# Patient Record
Sex: Male | Born: 1970 | Race: White | Hispanic: No | Marital: Married | Smoking: Never smoker
Health system: Southern US, Community
[De-identification: ages and names within clinical notes are randomized; demographics above are authoritative.]

## PROBLEM LIST (undated history)

## (undated) DIAGNOSIS — F419 Anxiety disorder, unspecified: Secondary | ICD-10-CM

## (undated) DIAGNOSIS — I10 Essential (primary) hypertension: Secondary | ICD-10-CM

## (undated) DIAGNOSIS — K219 Gastro-esophageal reflux disease without esophagitis: Secondary | ICD-10-CM

## (undated) DIAGNOSIS — G4730 Sleep apnea, unspecified: Secondary | ICD-10-CM

## (undated) DIAGNOSIS — J342 Deviated nasal septum: Secondary | ICD-10-CM

## (undated) HISTORY — PX: BACK SURGERY: SHX140

---

## 2001-09-23 ENCOUNTER — Ambulatory Visit (HOSPITAL_COMMUNITY): Admission: RE | Admit: 2001-09-23 | Discharge: 2001-09-23 | Payer: Self-pay | Admitting: *Deleted

## 2001-09-23 ENCOUNTER — Encounter: Payer: Self-pay | Admitting: *Deleted

## 2001-10-20 ENCOUNTER — Encounter: Payer: Self-pay | Admitting: Neurosurgery

## 2001-10-20 ENCOUNTER — Ambulatory Visit (HOSPITAL_COMMUNITY): Admission: RE | Admit: 2001-10-20 | Discharge: 2001-10-21 | Payer: Self-pay | Admitting: Neurosurgery

## 2015-10-13 ENCOUNTER — Emergency Department (HOSPITAL_BASED_OUTPATIENT_CLINIC_OR_DEPARTMENT_OTHER): Payer: No Typology Code available for payment source

## 2015-10-13 ENCOUNTER — Encounter (HOSPITAL_BASED_OUTPATIENT_CLINIC_OR_DEPARTMENT_OTHER): Payer: Self-pay | Admitting: Emergency Medicine

## 2015-10-13 ENCOUNTER — Observation Stay (HOSPITAL_BASED_OUTPATIENT_CLINIC_OR_DEPARTMENT_OTHER)
Admission: EM | Admit: 2015-10-13 | Discharge: 2015-10-14 | Disposition: A | Discharge status: Home / Self Care | Payer: No Typology Code available for payment source | Attending: Family Medicine | Admitting: Family Medicine

## 2015-10-13 DIAGNOSIS — R0600 Dyspnea, unspecified: Secondary | ICD-10-CM

## 2015-10-13 DIAGNOSIS — Z87898 Personal history of other specified conditions: Secondary | ICD-10-CM

## 2015-10-13 DIAGNOSIS — Z79899 Other long term (current) drug therapy: Secondary | ICD-10-CM | POA: Insufficient documentation

## 2015-10-13 DIAGNOSIS — R911 Solitary pulmonary nodule: Secondary | ICD-10-CM | POA: Insufficient documentation

## 2015-10-13 DIAGNOSIS — R0602 Shortness of breath: Secondary | ICD-10-CM | POA: Insufficient documentation

## 2015-10-13 DIAGNOSIS — Z8249 Family history of ischemic heart disease and other diseases of the circulatory system: Secondary | ICD-10-CM | POA: Diagnosis not present

## 2015-10-13 DIAGNOSIS — R072 Precordial pain: Secondary | ICD-10-CM | POA: Diagnosis not present

## 2015-10-13 DIAGNOSIS — S022XXA Fracture of nasal bones, initial encounter for closed fracture: Secondary | ICD-10-CM

## 2015-10-13 DIAGNOSIS — R918 Other nonspecific abnormal finding of lung field: Secondary | ICD-10-CM | POA: Diagnosis present

## 2015-10-13 DIAGNOSIS — R079 Chest pain, unspecified: Secondary | ICD-10-CM | POA: Diagnosis present

## 2015-10-13 DIAGNOSIS — I10 Essential (primary) hypertension: Secondary | ICD-10-CM | POA: Diagnosis not present

## 2015-10-13 DIAGNOSIS — R0689 Other abnormalities of breathing: Secondary | ICD-10-CM

## 2015-10-13 DIAGNOSIS — D491 Neoplasm of unspecified behavior of respiratory system: Secondary | ICD-10-CM

## 2015-10-13 HISTORY — DX: Essential (primary) hypertension: I10

## 2015-10-13 LAB — D-DIMER, QUANTITATIVE: D-Dimer, Quant: <0.27 ug/mL-FEU (ref 0.00–0.50)

## 2015-10-13 LAB — CBC WITH DIFFERENTIAL/PLATELET
Basophils Absolute: 0 10*3/uL (ref 0.0–0.1)
Basophils Relative: 0 %
EOS ABS: 0 10*3/uL (ref 0.0–0.7)
Eosinophils Relative: 0 %
HEMATOCRIT: 44.9 % (ref 39.0–52.0)
HEMOGLOBIN: 15.1 g/dL (ref 13.0–17.0)
LYMPHS ABS: 2 10*3/uL (ref 0.7–4.0)
Lymphocytes Relative: 29 %
MCH: 29.6 pg (ref 26.0–34.0)
MCHC: 33.6 g/dL (ref 30.0–36.0)
MCV: 88 fL (ref 78.0–100.0)
MONOS PCT: 9 %
Monocytes Absolute: 0.6 10*3/uL (ref 0.1–1.0)
NEUTROS ABS: 4.2 10*3/uL (ref 1.7–7.7)
NEUTROS PCT: 62 %
Platelets: 173 10*3/uL (ref 150–400)
RBC: 5.1 MIL/uL (ref 4.22–5.81)
RDW: 12.4 % (ref 11.5–15.5)
WBC: 6.8 10*3/uL (ref 4.0–10.5)

## 2015-10-13 LAB — BASIC METABOLIC PANEL
Anion gap: 9 (ref 5–15)
BUN: 12 mg/dL (ref 6–20)
CO2: 27 mmol/L (ref 22–32)
Calcium: 9 mg/dL (ref 8.9–10.3)
Chloride: 103 mmol/L (ref 101–111)
Creatinine, Ser: 1.01 mg/dL (ref 0.61–1.24)
GFR calc Af Amer: >60 mL/min (ref >60)
GFR calc non Af Amer: >60 mL/min (ref >60)
Glucose, Bld: 113 mg/dL — ABNORMAL HIGH (ref 65–99)
Potassium: 3.5 mmol/L (ref 3.5–5.1)
Sodium: 139 mmol/L (ref 135–145)

## 2015-10-13 LAB — TROPONIN I: Troponin I: <0.03 ng/mL (ref <0.03)

## 2015-10-13 MED ORDER — IOPAMIDOL (ISOVUE-300) INJECTION 61%
100.0000 mL | Freq: Once | INTRAVENOUS | Status: AC | PRN
  Administered 2015-10-13: 100 mL via INTRAVENOUS

## 2015-10-13 MED ORDER — ASPIRIN 81 MG PO CHEW
324.0000 mg | CHEWABLE_TABLET | Freq: Once | ORAL | Status: AC
  Administered 2015-10-13: 324 mg via ORAL
  Filled 2015-10-13: qty 4

## 2015-10-13 MED ORDER — NITROGLYCERIN 0.4 MG SL SUBL
0.4000 mg | SUBLINGUAL_TABLET | SUBLINGUAL | Status: DC | PRN
  Administered 2015-10-13 (×2): 0.4 mg via SUBLINGUAL
  Filled 2015-10-13: qty 1

## 2015-10-13 NOTE — ED Triage Notes (Addendum)
Pt in c/o central chest pain described as pressure and SOB, lightheadedness since last night. Grandfather and father died early of MIs. Pt is alert, interactive, in NAD

## 2015-10-13 NOTE — ED Notes (Signed)
RN at the bedside while giving Nitroglycerin

## 2015-10-13 NOTE — ED Provider Notes (Signed)
[facility] Provider Note   CSN: [id number] Arrival date & time: 10/13/15  2221  By signing my name below, I, Jeanell Sparrow, attest that this documentation has been prepared under the direction and in the presence of non-physician practitioner, Anne Ng, PA-C. Electronically Signed: Jeanell Sparrow, Scribe. 10/13/2015. 10:34 PM.  History   Chief Complaint Chief Complaint  Patient presents with  . Chest Pain  . Shortness of Breath   The history is provided by the patient. No language interpreter was used.   HPI Comments: William [name] is a 45 y.o. male who presents to the Emergency Department complaining of substernal chest pain that started 16 hours ago. He reports waking up from sleep in pain. Pt states it feels like heavy pressure on his chest and indigestion. Pt describes the pain as a 5/10 in severity, pressure sensation, and is exacerbated by exertion. He took no medication PTA. Pt reports associated symptoms of SOB, dry cough, and chills. 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 He reports a family hx of CAD; father had MI at age 92 (still living) and grandfather died from an MI at age 73. Pt denies any hx of HLD, DM, hx of smoking,  nausea, diaphoresis, or other complaints. STates he had a similar episode a couple months ago and went to [place] where it was suggested that he be admitted but he chose to leave AMA.  Past Medical History:  Diagnosis Date  . Hypertension     There are no active problems to display for this patient.  No past surgical history on file.   Home Medications    Prior to Admission medications   Not on File    Family History No family history on file.  Social History Social History  Substance Use Topics  . Smoking status: Never Smoker  . Smokeless tobacco: Not on file  . Alcohol use No    Allergies   Review of patient's allergies indicates no known allergies.  Review of  Systems Review of Systems  Constitutional: Positive for chills. Negative for diaphoresis.  Respiratory: Positive for cough and shortness of breath.   Cardiovascular: Positive for chest pain.  Gastrointestinal: Negative for nausea.  All other systems reviewed and are negative.    Physical Exam Updated Vital Signs BP 151/89   Pulse 79   Temp 98.5 F (36.9 C)   Resp 20   SpO2 98%   Physical Exam  Constitutional: He is oriented to person, place, and time.  Appears uncomfortable but NAD  HENT:  Right Ear: External ear normal.  Left Ear: External ear normal.  Nose: Nose normal.  Mouth/Throat: Oropharynx is clear and moist. No oropharyngeal exudate.  Eyes: Conjunctivae are normal.  Neck: Neck supple.  Cardiovascular: Normal rate, regular rhythm, normal heart sounds and intact distal pulses.   Pulmonary/Chest: Breath sounds normal. He has no wheezes.  Mildly increased WOB and will take deep breaths between sentences  Abdominal: Soft. Bowel sounds are normal. He exhibits no distension. There is no tenderness. There is no rebound and no guarding.  Musculoskeletal:  No LE edema or calf tenderness  Lymphadenopathy:    He has no cervical adenopathy.  Neurological: He is alert and oriented to person, place, and time. No cranial nerve deficit.  Skin: Skin is warm and dry. He is not diaphoretic.  Psychiatric: He has a normal mood and affect.  Nursing note and vitals reviewed.  ED Treatments / Results  DIAGNOSTIC STUDIES: Oxygen Saturation is 98% on RA, normal by my interpretation.    COORDINATION OF CARE: 10:38 PM- Pt advised of plan for treatment and pt agrees.  Labs (all labs ordered are listed, but only abnormal results are displayed) Labs Reviewed  BASIC METABOLIC PANEL - Abnormal; Notable for the following:       Result Value   Glucose, Bld 113 (*)    All other components within normal limits  TROPONIN I  CBC WITH DIFFERENTIAL/PLATELET  D-DIMER, QUANTITATIVE (NOT AT  [facility])    EKG  EKG Interpretation  Date/Time:  Saturday October 13 2015 22:26:53 EDT Ventricular Rate:  80 PR Interval:    QRS Duration: 109 QT Interval:  407 QTC Calculation: 470 R Axis:   85 Text Interpretation:  Sinus rhythm Atrial premature complex Nonspecific T abnormalities, lateral leads No previous ECGs available Confirmed by Florina Ou  MD, Jenny Reichmann ([id number]) on 10/13/2015 10:53:43 PM       Radiology Dg Chest 2 View  Result Date: 10/13/2015 CLINICAL DATA:  Central chest pain and pressure. Shortness of breath and lightheadedness. EXAM: CHEST  2 VIEW COMPARISON:  09/09/2015 FINDINGS: Normal heart size and pulmonary vascularity. Slight nodular prominence of the right hilum is probably vascular but hilar lymphadenopathy is not excluded. Suggest CT for further evaluation. No focal airspace disease or consolidation. No blunting of costophrenic angles. No pneumothorax. Mediastinal contours appear intact. IMPRESSION: Nodular prominence of the right hilum is probably vascular but lymphadenopathy is not excluded and CT is suggested for further evaluation. No evidence of active pulmonary disease. Electronically Signed   By: Lucienne Capers M.D.   On: 10/13/2015 23:21   Ct Chest W Contrast  Result Date: 10/14/2015 CLINICAL DATA:  Central chest pain.  Abnormal chest x-ray. EXAM: CT CHEST WITH CONTRAST TECHNIQUE: Multidetector CT imaging of the chest was performed during intravenous contrast administration. CONTRAST:  161mL ISOVUE-300 IOPAMIDOL (ISOVUE-300) INJECTION 61% COMPARISON:  Chest x-ray from earlier today FINDINGS: Cardiovascular: No significant vascular findings. Normal heart size. No pericardial effusion. Mediastinum/Nodes: Other than the nodule abutting the right hilum described below, the mediastinum is otherwise normal. No other suspicious mass or adenopathy. The central great vessels are normal. The heart is normal in size. No effusions. Lungs/Pleura: There is a nodule in the right mid lung  on series 2, image 73 measuring 2.5 cm, abutting the hilum. Whether this arises in the lung or the hilum is unclear. A calcified granuloma seen in the right upper lobe on image 65. A tiny nodule in the left lung on image 74 is too small to characterize, along the minor fissure measuring 4 mm. This is visualized on image 74. No other nodules or masses. Upper Abdomen: No acute abnormality. Musculoskeletal: No chest wall abnormality. No acute or significant osseous findings. IMPRESSION: 1. There is a 2.5 cm nodule abutting the right hilum. Whether this arises from the hilum or in the adjacent lung is unclear. 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 Malignancy not excluded. Recommend pulmonary consultation with consideration of a PET-CT. Electronically Signed   By: Dorise Bullion III M.D   On: 10/14/2015 00:12    Procedures Procedures (including critical care time)  Medications Ordered in ED Medications  nitroGLYCERIN (NITROSTAT) SL tablet 0.4 mg (0.4 mg Sublingual Given 10/13/15 2320)  diazepam (VALIUM) tablet 5 mg (not administered)  aspirin chewable tablet 324 mg (324 mg Oral Given 10/13/15 2311)  iopamidol (ISOVUE-300) 61 % injection 100 mL (100 mLs Intravenous Contrast  Given 10/13/15 2349)     Initial Impression / Assessment and Plan / ED Course  I have reviewed the triage vital signs and the nursing notes.  Pertinent labs & imaging results that were available during my care of the patient were reviewed by me and considered in my medical decision making (see chart for details).  Clinical Course   11:40 PM Pain improved with ASA and SL nitro x2. EKG with some T-wave and ST changes laterally. Troponin, d-dimer, CBC, and BMP negative. CXR with area of prominence to right hilum; cannot exclude lymphadenopathy and chest CT is recommend. CT chest with contrast ordered. HEART score 4.  12:51 AM CT chest shows 2.5cm nodule consistent with neoplasm, malignancy not excluded. Pulm consult and  PET-CT recommended. I discussed these findings with Dr. Florina Ou, my attending physician. We are not entirely convinced pt's symptoms are due to this mass. I do still feel pt would benefit from obs medical admission for chest pain workup. I spoke with pt and his family and they verbalized their understanding of today's findings and plan to transfer to [facility] for hospitalist admission. I spoke with Dr. Loleta Books of the hospitalist service who accepts the pt to tele obs. Appreciate assistance.  Final Clinical Impressions(s) / ED Diagnoses   Final diagnoses:  SOB (shortness of breath)  Chest pain, unspecified type  Pulmonary neoplasm    New Prescriptions New Prescriptions   No medications on file   I personally performed the services described in this documentation, which was scribed in my presence. The recorded information has been reviewed and is accurate.    Anne Ng, PA-C 10/14/15 [place], MD 10/14/15 [phone]

## 2015-10-13 NOTE — ED Notes (Signed)
Patient transported to CT 

## 2015-10-14 ENCOUNTER — Observation Stay (HOSPITAL_COMMUNITY): Payer: No Typology Code available for payment source

## 2015-10-14 ENCOUNTER — Encounter (HOSPITAL_COMMUNITY): Payer: Self-pay | Admitting: Internal Medicine

## 2015-10-14 ENCOUNTER — Telehealth: Payer: Self-pay | Admitting: Internal Medicine

## 2015-10-14 DIAGNOSIS — Z87898 Personal history of other specified conditions: Secondary | ICD-10-CM | POA: Diagnosis not present

## 2015-10-14 DIAGNOSIS — R911 Solitary pulmonary nodule: Secondary | ICD-10-CM

## 2015-10-14 DIAGNOSIS — R0600 Dyspnea, unspecified: Secondary | ICD-10-CM | POA: Diagnosis not present

## 2015-10-14 DIAGNOSIS — R079 Chest pain, unspecified: Secondary | ICD-10-CM | POA: Diagnosis not present

## 2015-10-14 DIAGNOSIS — R0602 Shortness of breath: Secondary | ICD-10-CM | POA: Diagnosis not present

## 2015-10-14 DIAGNOSIS — R918 Other nonspecific abnormal finding of lung field: Secondary | ICD-10-CM | POA: Diagnosis present

## 2015-10-14 DIAGNOSIS — I10 Essential (primary) hypertension: Secondary | ICD-10-CM | POA: Diagnosis not present

## 2015-10-14 DIAGNOSIS — R0689 Other abnormalities of breathing: Secondary | ICD-10-CM | POA: Diagnosis not present

## 2015-10-14 LAB — LIPID PANEL
Cholesterol: 188 mg/dL (ref 0–200)
HDL: 37 mg/dL — AB (ref >40)
LDL CALC: 126 mg/dL — AB (ref 0–99)
TRIGLYCERIDES: 127 mg/dL (ref <150)
Total CHOL/HDL Ratio: 5.1 RATIO
VLDL: 25 mg/dL (ref 0–40)

## 2015-10-14 LAB — RAPID URINE DRUG SCREEN, HOSP PERFORMED
Amphetamines: NOT DETECTED
BARBITURATES: NOT DETECTED
Benzodiazepines: NOT DETECTED
Cocaine: NOT DETECTED
Opiates: NOT DETECTED
Tetrahydrocannabinol: NOT DETECTED

## 2015-10-14 LAB — TROPONIN I: Troponin I: <0.03 ng/mL (ref <0.03)

## 2015-10-14 LAB — PROTIME-INR
INR: 1.04
PROTHROMBIN TIME: 13.6 s (ref 11.4–15.2)

## 2015-10-14 MED ORDER — ACETAMINOPHEN 325 MG PO TABS: 650.0000 mg | ORAL_TABLET | ORAL | Status: DC | PRN

## 2015-10-14 MED ORDER — ONDANSETRON HCL 4 MG/2ML IJ SOLN: 4.0000 mg | Freq: Four times a day (QID) | INTRAMUSCULAR | Status: DC | PRN

## 2015-10-14 MED ORDER — ATORVASTATIN CALCIUM 40 MG PO TABS
40.0000 mg | ORAL_TABLET | Freq: Every day | ORAL | Status: DC
  Administered 2015-10-14 (×2): 40 mg via ORAL
  Filled 2015-10-14 (×2): qty 1

## 2015-10-14 MED ORDER — IOPAMIDOL (ISOVUE-370) INJECTION 76%
INTRAVENOUS | Status: AC
  Administered 2015-10-14: 80 mL
  Filled 2015-10-14: qty 100

## 2015-10-14 MED ORDER — ASPIRIN 81 MG PO CHEW
324.0000 mg | CHEWABLE_TABLET | Freq: Every day | ORAL | Status: DC
  Administered 2015-10-14: 324 mg via ORAL
  Filled 2015-10-14: qty 4

## 2015-10-14 MED ORDER — LORATADINE 10 MG PO TABS: 10.0000 mg | ORAL_TABLET | Freq: Every day | ORAL | 0 refills | Status: DC

## 2015-10-14 MED ORDER — ALPRAZOLAM 0.25 MG PO TABS: 0.2500 mg | ORAL_TABLET | Freq: Two times a day (BID) | ORAL | Status: DC | PRN

## 2015-10-14 MED ORDER — CARVEDILOL 3.125 MG PO TABS
3.1250 mg | ORAL_TABLET | Freq: Two times a day (BID) | ORAL | Status: DC
  Administered 2015-10-14 (×2): 3.125 mg via ORAL
  Filled 2015-10-14 (×2): qty 1

## 2015-10-14 MED ORDER — ENOXAPARIN SODIUM 40 MG/0.4ML ~~LOC~~ SOLN
40.0000 mg | SUBCUTANEOUS | Status: DC
  Administered 2015-10-14: 40 mg via SUBCUTANEOUS
  Filled 2015-10-14: qty 0.4

## 2015-10-14 MED ORDER — SODIUM CHLORIDE 0.9 % IV SOLN
INTRAVENOUS | Status: DC
  Administered 2015-10-14: 100 mL/h via INTRAVENOUS

## 2015-10-14 MED ORDER — ALBUTEROL SULFATE (2.5 MG/3ML) 0.083% IN NEBU: 2.5000 mg | INHALATION_SOLUTION | RESPIRATORY_TRACT | Status: DC | PRN

## 2015-10-14 MED ORDER — METOPROLOL TARTRATE 5 MG/5ML IV SOLN
5.0000 mg | Freq: Once | INTRAVENOUS | Status: AC
  Administered 2015-10-14: 5 mg via INTRAVENOUS
  Filled 2015-10-14: qty 5

## 2015-10-14 MED ORDER — NITROGLYCERIN 0.4 MG SL SUBL
0.8000 mg | SUBLINGUAL_TABLET | Freq: Once | SUBLINGUAL | Status: AC
Start: 2015-10-14 — End: 2015-10-14
  Administered 2015-10-14: 0.8 mg via SUBLINGUAL

## 2015-10-14 MED ORDER — ZOLPIDEM TARTRATE 5 MG PO TABS: 5.0000 mg | ORAL_TABLET | Freq: Every evening | ORAL | Status: DC | PRN

## 2015-10-14 MED ORDER — DM-GUAIFENESIN ER 30-600 MG PO TB12: 1.0000 | ORAL_TABLET | Freq: Two times a day (BID) | ORAL | Status: DC | PRN

## 2015-10-14 MED ORDER — MORPHINE SULFATE (PF) 2 MG/ML IV SOLN: 2.0000 mg | INTRAVENOUS | Status: DC | PRN

## 2015-10-14 MED ORDER — DIAZEPAM 5 MG PO TABS
5.0000 mg | ORAL_TABLET | Freq: Once | ORAL | Status: AC
  Administered 2015-10-14: 5 mg via ORAL
  Filled 2015-10-14: qty 1

## 2015-10-14 MED ORDER — NITROGLYCERIN 0.4 MG SL SUBL
SUBLINGUAL_TABLET | SUBLINGUAL | Status: AC
  Administered 2015-10-14: 0.8 mg via SUBLINGUAL
  Filled 2015-10-14: qty 2

## 2015-10-14 NOTE — Progress Notes (Signed)
Transfer request for chest pain rule out.  44 yo M with no personal medical history presents with chest pain.    Woke him from sleep, pain is substernal, pressure-like, and worsened with exertion per EDP.  Patient has father and paternal GF with MI at age ~83.  ECG shows lateral TWI/flattening, no previous for comparison.  Initial troponin negative.  D-dimer normal.  HEART score 4.  Accepted to tele, OBS status for CP rule out.  Incidentally, patient had nodule on CXR, CT obtained in follow up that shows 2.5 cm mass.  Needs pulm consult as outpatient.

## 2015-10-14 NOTE — Progress Notes (Signed)
Coronary CT angio shows normal coronaries. There is mild LVH, which explains his S4 and may explain the ECG changes (secondary repol changes due to LVH with high voltage masked by obesity). LVH is likely due to HTN and obesity. Upper normal size of PA may increase concern for untreated OSA. He should have sleep evaluation after his nasal fracture situation is resolved. Otherwise, no plans for additional cardiac workup planned at this time.  Sanda Klein, MD, [facility] [phone] office [phone] pager

## 2015-10-14 NOTE — H&P (Signed)
History and Physical    William [name] [id number] DOB: 10-Aug-1970 DOA: 10/13/2015  Referring MD/NP/PA:   PCP: Ann Held, MD   Patient coming from:  The patient is coming from home.  At baseline, pt is independent for most of ADL.  Chief Complaint: Chest pain  HPI: William [name] is a 45 y.o. male with medical history significant of borderline hypertension, who presents with chest pain.  Patient states that she was waken up with chest pain at about 3 AM yesterday. His chest pain is located in the substernal area, constant, pressure-like, 3-5 out of 10 in severity, nonradiating. It is exertional, aggravated by movement. It is associated with SOB. He has chills, but no fever. 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 He has left leg cramping pain sometimes, but no tenderness over calf areas. He states that he has mild coughing, which has been going on for several weeks. He coughs up a little white mucus sometimes. No hemoptysis. No night sweating. No runny nose or sore throat. Patient does not have nausea, vomiting, abdominal pain, symptoms of UTI or unilateral weakness.   Pt sates he had a similar episode a couple months ago and went to [place] where it was suggested that he be admitted but he chose to leave AMA. He reports a family hx of CAD; father had MI at age 48 (still living) and grandfather died from an MI at age 45.   ED Course: pt was found to have negative troponin, WBC 6.8, negative d-dimer, electrolytes and renal function okay, temperature normal, transient oxygen saturation at 91%, currently 98% on room air. Chest x-ray showed nodular prominence of right hilum. CT chest with contrast showed 2.5 cm nodule in the right hilum, suspect neoplasm. Pt is placed on tele bed.  Review of Systems:   General: no fevers, chills, no changes in body weight, has poor appetite, has fatigue HEENT: no blurry vision, hearing changes or sore throat Respiratory: has dyspnea, coughing, no  wheezing CV: has chest pain, no palpitations GI: no nausea, vomiting, abdominal pain, diarrhea, constipation GU: no dysuria, burning on urination, increased urinary frequency, hematuria  Ext: no leg edema Neuro: no unilateral weakness, numbness, or tingling, no vision change or hearing loss Skin: no rash, no skin tear. MSK: No muscle spasm, no deformity, no limitation of range of movement in spin Heme: No easy bruising.  Travel history: No recent long distant travel.  Allergy: No Known Allergies  Past Medical History:  Diagnosis Date  . Hypertension     Past Surgical History:  Procedure Laterality Date  . BACK SURGERY      Social History:  reports that he has never smoked. He does not have any smokeless tobacco history on file. He reports that he does not drink alcohol. His drug history is not on file.  Family History:  Family History  Problem Relation Age of Onset  . Heart attack Father   . Heart attack Paternal Grandfather      Prior to Admission medications   Not on File    Physical Exam: Vitals:   10/14/15 0200 10/14/15 0230 10/14/15 0300 10/14/15 0427  BP: 120/82 117/79 128/92 (!) 143/91  Pulse: 76 68 71 79  Resp: 16 17 18    Temp:    98.2 F (36.8 C)  TempSrc:    Oral  SpO2: 98% 96% 98% 98%  Weight:    115.8 kg (255 lb 4.8 oz)  Height:    5\' 10"  (1.778 m)  General: Not in acute distress HEENT:       Eyes: PERRL, EOMI, no scleral icterus.       ENT: No discharge from the ears and nose, no pharynx injection, no tonsillar enlargement.        Neck: No JVD, no bruit, no mass felt. Heme: No neck lymph node enlargement. Cardiac: S1/S2, RRR, No murmurs, No gallops or rubs. Respiratory: No rales, wheezing, rhonchi or rubs. GI: Soft, nondistended, nontender, no rebound pain, no organomegaly, BS present. GU: No hematuria Ext: No pitting leg edema bilaterally. 2+DP/PT pulse bilaterally. Musculoskeletal: No joint deformities, No joint redness or warmth, no  limitation of ROM in spin. Skin: No rashes.  Neuro: Alert, oriented X3, cranial nerves II-XII grossly intact, moves all extremities normally.  Psych: Patient is not psychotic, no suicidal or hemocidal ideation.  Labs on Admission: I have personally reviewed following labs and imaging studies  CBC:  Recent Labs Lab 10/13/15 2245  WBC 6.8  NEUTROABS 4.2  HGB 15.1  HCT 44.9  MCV 88.0  PLT [id number]   Basic Metabolic Panel:  Recent Labs Lab 10/13/15 2245  NA 139  K 3.5  CL 103  CO2 27  GLUCOSE 113*  BUN 12  CREATININE 1.01  CALCIUM 9.0   GFR: Estimated Creatinine Clearance: 117.7 mL/min (by C-G formula based on SCr of 1.01 mg/dL). Liver Function Tests: No results for input(s): AST, ALT, ALKPHOS, BILITOT, PROT, ALBUMIN in the last 168 hours. No results for input(s): LIPASE, AMYLASE in the last 168 hours. No results for input(s): AMMONIA in the last 168 hours. Coagulation Profile: No results for input(s): INR, PROTIME in the last 168 hours. Cardiac Enzymes:  Recent Labs Lab 10/13/15 2245  TROPONINI <0.03   BNP (last 3 results) No results for input(s): PROBNP in the last 8760 hours. HbA1C: No results for input(s): HGBA1C in the last 72 hours. CBG: No results for input(s): GLUCAP in the last 168 hours. Lipid Profile: No results for input(s): CHOL, HDL, LDLCALC, TRIG, CHOLHDL, LDLDIRECT in the last 72 hours. Thyroid Function Tests: No results for input(s): TSH, T4TOTAL, FREET4, T3FREE, THYROIDAB in the last 72 hours. Anemia Panel: No results for input(s): VITAMINB12, FOLATE, FERRITIN, TIBC, IRON, RETICCTPCT in the last 72 hours. Urine analysis: No results found for: COLORURINE, APPEARANCEUR, LABSPEC, PHURINE, GLUCOSEU, HGBUR, BILIRUBINUR, KETONESUR, PROTEINUR, UROBILINOGEN, NITRITE, LEUKOCYTESUR Sepsis Labs: @LABRCNTIP (procalcitonin:4,lacticidven:4) )No results found for this or any previous visit (from the past 240 hour(s)).   Radiological Exams on Admission: Dg  Chest 2 View  Result Date: 10/13/2015 CLINICAL DATA:  Central chest pain and pressure. Shortness of breath and lightheadedness. EXAM: CHEST  2 VIEW COMPARISON:  09/09/2015 FINDINGS: Normal heart size and pulmonary vascularity. Slight nodular prominence of the right hilum is probably vascular but hilar lymphadenopathy is not excluded. Suggest CT for further evaluation. No focal airspace disease or consolidation. No blunting of costophrenic angles. No pneumothorax. Mediastinal contours appear intact. IMPRESSION: Nodular prominence of the right hilum is probably vascular but lymphadenopathy is not excluded and CT is suggested for further evaluation. No evidence of active pulmonary disease. Electronically Signed   By: Lucienne Capers M.D.   On: 10/13/2015 23:21   Ct Chest W Contrast  Result Date: 10/14/2015 CLINICAL DATA:  Central chest pain.  Abnormal chest x-ray. EXAM: CT CHEST WITH CONTRAST TECHNIQUE: Multidetector CT imaging of the chest was performed during intravenous contrast administration. CONTRAST:  119mL ISOVUE-300 IOPAMIDOL (ISOVUE-300) INJECTION 61% COMPARISON:  Chest x-ray from earlier today FINDINGS: Cardiovascular: No significant  vascular findings. Normal heart size. No pericardial effusion. Mediastinum/Nodes: Other than the nodule abutting the right hilum described below, the mediastinum is otherwise normal. No other suspicious mass or adenopathy. The central great vessels are normal. The heart is normal in size. No effusions. Lungs/Pleura: There is a nodule in the right mid lung on series 2, image 73 measuring 2.5 cm, abutting the hilum. Whether this arises in the lung or the hilum is unclear. A calcified granuloma seen in the right upper lobe on image 65. A tiny nodule in the left lung on image 74 is too small to characterize, along the minor fissure measuring 4 mm. This is visualized on image 74. No other nodules or masses. Upper Abdomen: No acute abnormality. Musculoskeletal: No chest wall  abnormality. No acute or significant osseous findings. IMPRESSION: 1. There is a 2.5 cm nodule abutting the right hilum. Whether this arises from the hilum or in the adjacent lung is unclear. 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 Malignancy not excluded. Recommend pulmonary consultation with consideration of a PET-CT. Electronically Signed   By: Dorise Bullion III M.D   On: 10/14/2015 00:12     EKG: Independently reviewed.  Mild ST/T disturbance in lateral lead, inferior leads, V5-V6  Assessment/Plan Principal Problem:   Chest pain Active Problems:   Hypertension   Lung nodule   Chest pain: Etiology is not clear. D-dimer is negative, less likely to have PE. CT chest that showed 2.5 cm nodule in the right hilum, suspect neoplasma, but it should not cause significant chest pain. Give his significant family history of heart attack, will need to rule out ACS.  - place on tele bed for obs - cycle CE q6 x3 and repeat EKG in the am  - prn Nitroglycerin, Morphine, and aspirin - start lipitor and coreg - Risk factor stratification: will check FLP and A1C  - 2d echo - please call Card in AM  Bord line  Hypertension: bp 128/92. No on bp Meds at home -started coreg, 3.125 mg bid as above  Lung nodule: Incidental finding, suspect neoplasm. It may have partially contributed to his cough and shortness of breath. -May consult to or give referral to pulmonology for further work up. -When necessary Mucinex for cough -When necessary albuterol for shortness breath   DVT ppx: SQ Lovenox Code Status: Full code Family Communication: Yes, patient's wife at bed side Disposition Plan:  Anticipate discharge back to previous home environment Consults called:  none Admission status: Obs / tele   Date of Service 10/14/2015    Niyla Marone, [facility] Pager [phone]  If 7PM-7AM, please contact night-coverage [email or web address] Password [email or web address] 10/14/2015, 5:32 AM

## 2015-10-14 NOTE — Discharge Instructions (Signed)
Shortness of Breath Shortness of breath means you have trouble breathing. It could also mean that you have a medical problem. You should get immediate medical care for shortness of breath. CAUSES   Not enough oxygen in the air such as with high altitudes or a smoke-filled room.  Certain lung diseases, infections, or problems.  Heart disease or conditions, such as angina or heart failure.  Low red blood cells (anemia).  Poor physical fitness, which can cause shortness of breath when you exercise.  Chest or back injuries or stiffness.  Being overweight.  Smoking.  Anxiety, which can make you feel like you are not getting enough air. DIAGNOSIS  Serious medical problems can often be found during your physical exam. Tests may also be done to determine why you are having shortness of breath. Tests may include:  Chest X-rays.  Lung function tests.  Blood tests.  An electrocardiogram (ECG).  An ambulatory electrocardiogram. An ambulatory ECG records your heartbeat patterns over a 24-hour period.  Exercise testing.  A transthoracic echocardiogram (TTE). During echocardiography, sound waves are used to evaluate how blood flows through your heart.  A transesophageal echocardiogram (TEE).  Imaging scans. Your health care provider may not be able to find a cause for your shortness of breath after your exam. In this case, it is important to have a follow-up exam with your health care provider as directed.  TREATMENT  Treatment for shortness of breath depends on the cause of your symptoms and can vary greatly. HOME CARE INSTRUCTIONS   Do not smoke. Smoking is a common cause of shortness of breath. If you smoke, ask for help to quit.  Avoid being around chemicals or things that may bother your breathing, such as paint fumes and dust.  Rest as needed. Slowly resume your usual activities.  If medicines were prescribed, take them as directed for the full length of time directed. This  includes oxygen and any inhaled medicines.  Keep all follow-up appointments as directed by your health care provider. SEEK MEDICAL CARE IF:   Your condition does not improve in the time expected.  You have a hard time doing your normal activities even with rest.  You have any new symptoms. SEEK IMMEDIATE MEDICAL CARE IF:   Your shortness of breath gets worse.  You feel light-headed, faint, or develop a cough not controlled with medicines.  You start coughing up blood.  You have pain with breathing.  You have chest pain or pain in your arms, shoulders, or abdomen.  You have a fever.  You are unable to walk up stairs or exercise the way you normally do. MAKE SURE YOU:  Understand these instructions.  Will watch your condition.  Will get help right away if you are not doing well or get worse.   This information is not intended to replace advice given to you by your health care provider. Make sure you discuss any questions you have with your health care provider.   Document Released: 09/17/2000 Document Revised: 12/28/2012 Document Reviewed: 03/10/2011 Elsevier Interactive Patient Education 2016 [place]. Follow with Primary MD Ann Held, MD in 7 days   Get CBC,  checked  by Primary MD next visit.    Activity: As tolerated with Full fall precautions use walker/cane & assistance as needed   Disposition Home    Diet: Heart Healthy  , with feeding assistance and aspiration precautions.  For Heart failure patients - Check your Weight same time everyday, if you gain over 2  pounds, or you develop in leg swelling, experience more shortness of breath or chest pain, call your Primary MD immediately. Follow Cardiac Low Salt Diet and 1.5 lit/day fluid restriction.   On your next visit with your primary care physician please Get Medicines reviewed and adjusted.   Please request your Prim.MD to go over all Hospital Tests and Procedure/Radiological results at the follow  up, please get all Hospital records sent to your Prim MD by signing hospital release before you go home.   If you experience worsening of your admission symptoms, develop shortness of breath, life threatening emergency, suicidal or homicidal thoughts you must seek medical attention immediately by calling 911 or calling your MD immediately  if symptoms less severe.  You Must read complete instructions/literature along with all the possible adverse reactions/side effects for all the Medicines you take and that have been prescribed to you. Take any new Medicines after you have completely understood and accpet all the possible adverse reactions/side effects.   Do not drive, operating heavy machinery, perform activities at heights, swimming or participation in water activities or provide baby sitting services if your were admitted for syncope or siezures until you have seen by Primary MD or a Neurologist and advised to do so again.  Do not drive when taking Pain medications.    Do not take more than prescribed Pain, Sleep and Anxiety Medications  Special Instructions: If you have smoked or chewed Tobacco  in the last 2 yrs please stop smoking, stop any regular Alcohol  and or any Recreational drug use.  Wear Seat belts while driving.   Please note  You were cared for by a hospitalist during your hospital stay. If you have any questions about your discharge medications or the care you received while you were in the hospital after you are discharged, you can call the unit and asked to speak with the hospitalist on call if the hospitalist that took care of you is not available. Once you are discharged, your primary care physician will handle any further medical issues. Please note that NO REFILLS for any discharge medications will be authorized once you are discharged, as it is imperative that you return to your primary care physician (or establish a relationship with a primary care physician if you do  not have one) for your aftercare needs so that they can reassess your need for medications and monitor your lab values.

## 2015-10-14 NOTE — Consult Note (Signed)
PULMONARY / CRITICAL CARE MEDICINE   Name: William [name] MRN: [id number] DOB: [birth year]    ADMISSION DATE:  10/13/2015 CONSULTATION DATE:  .10/14/2015   REFERRING MD:  Dr Sharlee Blew  CHIEF COMPLAINT:  Dyspnea, snoring, rul lung mass  HISTORY OF PRESENT ILLNESS:   45 year old obese male. History is provided by the patient, review of the chart and the patient wife.  He is a lifelong nonsmoker. He works for fixing garages. He is obese and has a multiyear history of snoring with nocturnal apnea episodes but without any daytime sleepiness or fatigue. He does not have dyspnea at baseline for his garage and work. Some 3 months ago was playing baseball and got hit on the nose. He sustained nasal fracture and deviation of the nasal bones with fracture not otherwise specified. Apparently he got evaluated at [facility] and was advised surgery but he has not had this. In approximately starting one month ago noticed insidious onset of shortness of breath. This dyspnea is random, it also is positional. He says that when he lies down on his right side it is better. This is because the left nostril which is more patent is more superior. It is also better when he does the allergies are reviewed. Also better when he has to press on his left-sided maxillary sinuses. Since his original CT of the time of trauma he's not had any other follow-up CT scan. He's avoided having surgery because of fear of surgery. Today walking desaturation test along the hallway of the medical ward shows he did not desaturate below 96%. Heart rate remained at 70. He did not get dyspneic. There is no cough or wheezing. There is some chest tightness  chest tightness associated with this dyspnea. Therefore he was admitted and MI ruled out. Coronary CT scan is normal.  As part of this workup. Have a CT chest that shows a right upper lobe perihilar mass 2.5 cm and lobulated. He is nonsmoker. Is no mediastinal  adenopathy. This masses neck to the hilum. Unclear of the origins are pulmonary parenchymal. I personally visualized the CT and agree.  Wife and he wants evaluation for sleep apnea because his symptoms of sleep apnea narrated above worse after the nasal fracture. Apparently primary care physician is trying to arrange for a home sleep study.   PAST MEDICAL HISTORY :  He  has a past medical history of Hypertension.  PAST SURGICAL HISTORY: He  has a past surgical history that includes Back surgery.  No Known Allergies  No current facility-administered medications on file prior to encounter.    No current outpatient prescriptions on file prior to encounter.    FAMILY HISTORY:  His indicated that the status of his father is unknown. He indicated that the status of his paternal grandfather is unknown.    SOCIAL HISTORY: He  reports that he has never smoked. He has never used smokeless tobacco. He reports that he does not drink alcohol.  REVIEW OF SYSTEMS:   11 point review of systems is negative otherwise as stated above   VITAL SIGNS: BP 136/90 (BP Location: Right Arm)   Pulse 75   Temp 98.6 F (37 C) (Oral)   Resp 18   Ht 5\' 10"  (1.778 m)   Wt 115.8 kg (255 lb 4.8 oz)   SpO2 100%   BMI 36.63 kg/m   HEMODYNAMICS:    VENTILATOR SETTINGS:    INTAKE / OUTPUT: I/O last 3 completed shifts: In: 56 [I.V.:75]  Out: -   PHYSICAL EXAMINATION: General:  Obese male lying comfortably in the bed Psych: Flat affect but pleasant Neuro:  Alert and oriented 3. Speech normal gait is normal HEENT:  Right-sided deviated septum with significant reduction in nasal air passage in the right side Cardiovascular:  Normal heart sounds regular rate and rhythm Lungs:  Clear to auscultation bilaterally but overall diminished due to obesity Abdomen:  Soft nontender no organomegaly obese abdomen Musculoskeletal:  No sinus no clubbing no edema Skin:  Dry and intact in the exposed  areas  LABS:  BMET  Recent Labs Lab 10/13/15 2245  NA 139  K 3.5  CL 103  CO2 27  BUN 12  CREATININE 1.01  GLUCOSE 113*    Electrolytes  Recent Labs Lab 10/13/15 2245  CALCIUM 9.0    CBC  Recent Labs Lab 10/13/15 2245  WBC 6.8  HGB 15.1  HCT 44.9  PLT 173    Coag's  Recent Labs Lab 10/14/15 0520  INR 1.04    Sepsis Markers No results for input(s): LATICACIDVEN, PROCALCITON, O2SATVEN in the last 168 hours.  ABG No results for input(s): PHART, PCO2ART, PO2ART in the last 168 hours.  Liver Enzymes No results for input(s): AST, ALT, ALKPHOS, BILITOT, ALBUMIN in the last 168 hours.  Cardiac Enzymes  Recent Labs Lab 10/13/15 2245 10/14/15 0520 10/14/15 1106  TROPONINI <0.03 <0.03 <0.03    Glucose No results for input(s): GLUCAP in the last 168 hours.  ImagingPersonally visualized all these images Dg Chest 2 View  Result Date: 10/13/2015 CLINICAL DATA:  Central chest pain and pressure. Shortness of breath and lightheadedness. EXAM: CHEST  2 VIEW COMPARISON:  09/09/2015 FINDINGS: Normal heart size and pulmonary vascularity. Slight nodular prominence of the right hilum is probably vascular but hilar lymphadenopathy is not excluded. Suggest CT for further evaluation. No focal airspace disease or consolidation. No blunting of costophrenic angles. No pneumothorax. Mediastinal contours appear intact. IMPRESSION: Nodular prominence of the right hilum is probably vascular but lymphadenopathy is not excluded and CT is suggested for further evaluation. No evidence of active pulmonary disease. Electronically Signed   By: Lucienne Capers M.D.   On: 10/13/2015 23:21   Ct Chest W Contrast  Result Date: 10/14/2015 CLINICAL DATA:  Central chest pain.  Abnormal chest x-ray. EXAM: CT CHEST WITH CONTRAST TECHNIQUE: Multidetector CT imaging of the chest was performed during intravenous contrast administration. CONTRAST:  147mL ISOVUE-300 IOPAMIDOL (ISOVUE-300)  INJECTION 61% COMPARISON:  Chest x-ray from earlier today FINDINGS: Cardiovascular: No significant vascular findings. Normal heart size. No pericardial effusion. Mediastinum/Nodes: Other than the nodule abutting the right hilum described below, the mediastinum is otherwise normal. No other suspicious mass or adenopathy. The central great vessels are normal. The heart is normal in size. No effusions. Lungs/Pleura: There is a nodule in the right mid lung on series 2, image 73 measuring 2.5 cm, abutting the hilum. Whether this arises in the lung or the hilum is unclear. A calcified granuloma seen in the right upper lobe on image 65. A tiny nodule in the left lung on image 74 is too small to characterize, along the minor fissure measuring 4 mm. This is visualized on image 74. No other nodules or masses. Upper Abdomen: No acute abnormality. Musculoskeletal: No chest wall abnormality. No acute or significant osseous findings. IMPRESSION: 1. There is a 2.5 cm nodule abutting the right hilum. Whether this arises from the hilum or in the adjacent lung is unclear. The findings are consistent with  a neoplasm. Malignancy not excluded. Recommend pulmonary consultation with consideration of a PET-CT. Electronically Signed   By: Dorise Bullion III M.D   On: 10/14/2015 00:12   Ct Coronary Morph W/cta Cor Nancy Fetter W/ca W/cm &/or Wo/cm  Result Date: 10/14/2015 CLINICAL DATA:  45 year old male with SOB, hypertension and family h/o premature CAD. EXAM: Cardiac/Coronary  CT TECHNIQUE: The patient was scanned on a Philips 256 scanner. FINDINGS: A 120 kV prospective scan was triggered in the descending thoracic aorta at 111 HU's. Axial non-contrast 3 mm slices were carried out through the heart. The data set was analyzed on a dedicated work station and scored using the [place]. Gantry rotation speed was 270 msecs and collimation was .9 mm. 5 mg of iv metoprolol and 0.8 mg of sl NTG was given. The 3D data set was reconstructed  in 5% intervals of the 67-82 % of the R-R cycle. Diastolic phases were analyzed on a dedicated work station using MPR, MIP and VRT modes. The patient received 80 cc of contrast. Aorta:  Normal size.  No calcifications.  No dissection. Aortic Valve:  Trileaflet.  No calcifications. Coronary Arteries:  Normal coronary origin.  Right dominance. RCA is a large dominant artery that gives rise to PDA and PLVB. There is a motion that affects the read but there is no plaque. Left main is a large artery that gives rise to LAD and LCX arteries. LAD is a medium caliber vessel that gives rise to two small diagonal vessels and has no plaque. LCX is a small non-dominant artery that gives rise to one OM1 branch. There is no plaque. Other findings: Normal pulmonary vein drainage into the left atrium. Normal let atrial appendage without a thrombus. Mild to moderate concentric left ventricular hypertrophy. Upper normal size of the pulmonary artery measuring 30 mm. No ASD or VSD seen. IMPRESSION: 1. Coronary calcium score of 0. This was 0 percentile for age and sex matched control. 2. Normal coronary origin with right dominance. 3. No evidence of CAD. 4. Mild to moderate concentric left ventricular hypertrophy. 5. Upper normal size of the pulmonary artery measuring 30 mm. Ena Dawley Electronically Signed   By: Ena Dawley   On: 10/14/2015 13:34     ASSESSMENT / PLAN:  PULMONARY A: Several issues  #Right upper lobe lobulated lung nodule - low intermediate risk for lung cancer because of age and nonsmoking history  - We'll ask pulmonary office to do a super D CT scan of his admission scan -Sent information to pulmonary office to arrange for an outpatient PET scan - Follow-up with Dr. [name], lung nodules specialist - my office will set this up  #Dyspnea - This is interesting. It is new. It follows the nasal fracture. The history is nonexertional but positional. I think it is related to the nasal fracture.  CT scan does not show any interstitial lung disease - We'll get sinus CT this admission - I will ordered full pulmonary function test to be done this admission if he is around otherwise we will do this as an outpatient - During pulmonary follow-up he might benefit from a nitric oxide test to rule out asthma - We will continue to follow this  #Sleep apnea history - High pretest probably ready for sleep apnea - I have asked my office to set up an appointment with one of the sleep specialist in our office who can also address his dyspnea   FAMILY  - Updates: I updated his wife,  himself and his daughter in detail at the bedside and the patient room on [address] medical floor  [facility] and will sign off call us if any questions. Please ensure that the follow-up is being arranged for office at the time of discharge. If any questions please call [phone]   Dr. Brand Males, M.D., [facility].C.P Pulmonary and Critical Care Medicine Staff Physician [place] [facility] Pager: [phone], If no answer or between  15:00h - 7:00h: call [phone]  10/14/2015 2:55 PM

## 2015-10-14 NOTE — Progress Notes (Signed)
Pt became very short of breath at rest. RN turned off NS that was going at 171ml/h. 02 applied. Will Continue to monitor

## 2015-10-14 NOTE — Telephone Encounter (Signed)
Triage  1. Please get William [name] in with William [name] with super-d made of this CT from 10/13/15 and also get a PET scan for RUL nodule. /lung mass. Non-smoker  2. Hx of bad OSA -and dyspnea - set fu with one of sleep docs in office   Thanks  William [name], M.D., [facility].C.P Pulmonary and Critical Care Medicine Staff Physician William [name] Pulmonary and Critical Care Pager: [phone], If no answer or between  15:00h - 7:00h: call [phone]  10/14/2015 2:47 PM       Dg Chest 2 View  Result Date: 10/13/2015 CLINICAL DATA:  Central chest pain and pressure. Shortness of breath and lightheadedness. EXAM: CHEST  2 VIEW COMPARISON:  09/09/2015 FINDINGS: Normal heart size and pulmonary vascularity. Slight nodular prominence of the right hilum is probably vascular but hilar lymphadenopathy is not excluded. Suggest CT for further evaluation. No focal airspace disease or consolidation. No blunting of costophrenic angles. No pneumothorax. Mediastinal contours appear intact. IMPRESSION: Nodular prominence of the right hilum is probably vascular but lymphadenopathy is not excluded and CT is suggested for further evaluation. No evidence of active pulmonary disease. Electronically Signed   By: William [name] M.D.   On: 10/13/2015 23:21   Ct Chest W Contrast  Result Date: 10/14/2015 CLINICAL DATA:  Central chest pain.  Abnormal chest x-ray. EXAM: CT CHEST WITH CONTRAST TECHNIQUE: Multidetector CT imaging of the chest was performed during intravenous contrast administration. CONTRAST:  11mL ISOVUE-300 IOPAMIDOL (ISOVUE-300) INJECTION 61% COMPARISON:  Chest x-ray from earlier today FINDINGS: Cardiovascular: No significant vascular findings. Normal heart size. No pericardial effusion. Mediastinum/Nodes: Other than the nodule abutting the right hilum described below, the mediastinum is otherwise normal. No other suspicious mass or adenopathy. The central great vessels are normal.  The heart is normal in size. No effusions. Lungs/Pleura: There is a nodule in the right mid lung on series 2, image 73 measuring 2.5 cm, abutting the hilum. Whether this arises in the lung or the hilum is unclear. A calcified granuloma seen in the right upper lobe on image 65. A tiny nodule in the left lung on image 74 is too small to characterize, along the minor fissure measuring 4 mm. This is visualized on image 74. No other nodules or masses. Upper Abdomen: No acute abnormality. Musculoskeletal: No chest wall abnormality. No acute or significant osseous findings. IMPRESSION: 1. There is a 2.5 cm nodule abutting the right hilum. Whether this arises from the hilum or in the adjacent lung is unclear. 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 Malignancy not excluded. Recommend pulmonary consultation with consideration of a PET-CT. Electronically Signed   By: William [name] M.D   On: 10/14/2015 00:12   Ct Coronary Morph W/cta Cor Nancy Fetter W/ca W/cm &/or Wo/cm  Result Date: 10/14/2015 CLINICAL DATA:  45 year old male with SOB, hypertension and family h/o premature CAD. EXAM: Cardiac/Coronary  CT TECHNIQUE: The patient was scanned on a Philips 256 scanner. FINDINGS: A 120 kV prospective scan was triggered in the descending thoracic aorta at 111 HU's. Axial non-contrast 3 mm slices were carried out through the heart. The data set was analyzed on a dedicated work station and scored using the [place]. Gantry rotation speed was 270 msecs and collimation was .9 mm. 5 mg of iv metoprolol and 0.8 mg of sl NTG was given. The 3D data set was reconstructed in 5% intervals of the 67-82 % of the R-R cycle. Diastolic phases  were analyzed on a dedicated work station using MPR, MIP and VRT modes. The patient received 80 cc of contrast. Aorta:  Normal size.  No calcifications.  No dissection. Aortic Valve:  Trileaflet.  No calcifications. Coronary Arteries:  Normal coronary origin.  Right dominance. RCA is a large  dominant artery that gives rise to PDA and PLVB. There is a motion that affects the read but there is no plaque. Left main is a large artery that gives rise to LAD and LCX arteries. LAD is a medium caliber vessel that gives rise to two small diagonal vessels and has no plaque. LCX is a small non-dominant artery that gives rise to one OM1 branch. There is no plaque. Other findings: Normal pulmonary vein drainage into the left atrium. Normal let atrial appendage without a thrombus. Mild to moderate concentric left ventricular hypertrophy. Upper normal size of the pulmonary artery measuring 30 mm. No ASD or VSD seen. IMPRESSION: 1. Coronary calcium score of 0. This was 0 percentile for age and sex matched control. 2. Normal coronary origin with right dominance. 3. No evidence of CAD. 4. Mild to moderate concentric left ventricular hypertrophy. 5. Upper normal size of the pulmonary artery measuring 30 mm. William [name] Electronically Signed   By: William [name]   On: 10/14/2015 13:34

## 2015-10-14 NOTE — Progress Notes (Signed)
Pt discharged home. Discharge instructions have been gone over with the patient. IV's removed. Pt given unit number and told to call if they have any concerns regarding their discharge instructions. Pt also given note with the dates he was hospitalized. Etta Quill, RN

## 2015-10-14 NOTE — Progress Notes (Signed)
SATURATION QUALIFICATIONS: (This note is used to comply with regulatory documentation for home oxygen)  Patient Saturations on Room Air at Rest = 100%  Patient Saturations on Room Air while Ambulating = 96%  Patient Saturations on  Liters of oxygen while Ambulating = %  Please briefly explain why patient needs home oxygen: 

## 2015-10-14 NOTE — Consult Note (Signed)
    Reason for Consult: chest pain   Referring Physician: Dr. Elgergawy    PCP:  SCOTT, ROBERT, MD  Primary Cardiologist:New   William [name] is an 44 y.o. male.    Chief Complaint:  admitted 10/14/15 after presenting with chest pain.   HPI:  44 yom with no prior cardiac hx and only borderline HTN and + premature CAD in family.   Father with MI at 50 and PGF died with MI at 51.  Presented to ER last night due to central chest pain, pressure pain and SOB, some lightheadedness.   The SOB has wakened him from sleep about a month ago and he went to [facility] and they thought more of panic attack.  Also that he needed sleep study.   In the meantime he was found to have a fx nose and saw ENT.  His PCP also wished him to have ETT but not yet done.    Yesterday he did not feel usual self but mowed the grass felt better then began feeling SOB again.  Did have a cough. He was able to play basketball with his son but the SOB remained and then increased during the night.  He has chest pressure as well but the SOB is the most obvious to him.  Pressure described 2/10 and SOB as 5/10.    Here  EKG SR PAC and with non specific ST abnormalities inf. Lat. Follow up this AM SR with non specific changes.  Troponins have been negative <0.03 X 2 .   K+ 3.5, Cr. 1.01 Hgb 15.1.  CXR with possible nodule and CT ordered.   "There is a 2.5 cm nodule abutting the right hilum. Whether this arises from the hilum or in the adjacent lung is unclear. 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 Malignancy not excluded. Recommend pulmonary consultation with consideration of a PET-CT."  Currently felt SOB and oxygen applied.  Will recheck EKG.   Remote hx of skipped beats, no awareness of heart beat.    Past Medical History:  Diagnosis Date  . Hypertension     Past Surgical History:  Procedure Laterality Date  . BACK SURGERY      Family History  Problem Relation Age of Onset  . Heart attack Father   .  Heart attack Paternal Grandfather    Social History:  reports that he has never smoked. He has never used smokeless tobacco. He reports that he does not drink alcohol. His drug history is not on file.  Allergies: No Known Allergies  Scheduled Meds: . aspirin  324 mg Oral Daily  . atorvastatin  40 mg Oral q1800  . carvedilol  3.125 mg Oral BID WC  . enoxaparin (LOVENOX) injection  40 mg Subcutaneous Q24H  . metoprolol  5 mg Intravenous Once  . nitroGLYCERIN  0.8 mg Sublingual Once   Continuous Infusions: . sodium chloride 100 mL/hr (10/14/15 0611)   PRN Meds:.acetaminophen, albuterol, ALPRAZolam, dextromethorphan-guaiFENesin, morphine injection, nitroGLYCERIN, ondansetron (ZOFRAN) IV, zolpidem  acetaminophen, albuterol, ALPRAZolam, dextromethorphan-guaiFENesin, morphine injection, nitroGLYCERIN, ondansetron (ZOFRAN) IV, zolpidem  Results for orders placed or performed during the hospital encounter of 10/13/15 (from the past 48 hour(s))  Basic metabolic panel     Status: Abnormal   Collection Time: 10/13/15 10:45 PM  Result Value Ref Range   Sodium 139 135 - 145 mmol/L   Potassium 3.5 3.5 - 5.1 mmol/L   Chloride 103 101 - 111 mmol/L   CO2 27 22 - 32 mmol/L     Glucose, Bld 113 (H) 65 - 99 mg/dL   BUN 12 6 - 20 mg/dL   Creatinine, Ser 1.01 0.61 - 1.24 mg/dL   Calcium 9.0 8.9 - 10.3 mg/dL   GFR calc non Af Amer >60 >60 mL/min   GFR calc Af Amer >60 >60 mL/min    Comment: (NOTE) The eGFR has been calculated using the CKD EPI equation. This calculation has not been validated in all clinical situations. eGFR's persistently <60 mL/min signify possible Chronic Kidney Disease.    Anion gap 9 5 - 15  Troponin I     Status: None   Collection Time: 10/13/15 10:45 PM  Result Value Ref Range   Troponin I <0.03 <0.03 ng/mL  CBC with Differential     Status: None   Collection Time: 10/13/15 10:45 PM  Result Value Ref Range   WBC 6.8 4.0 - 10.5 K/uL   RBC 5.10 4.22 - 5.81 MIL/uL    Hemoglobin 15.1 13.0 - 17.0 g/dL   HCT 44.9 39.0 - 52.0 %   MCV 88.0 78.0 - 100.0 fL   MCH 29.6 26.0 - 34.0 pg   MCHC 33.6 30.0 - 36.0 g/dL   RDW 12.4 11.5 - 15.5 %   Platelets 173 150 - 400 K/uL   Neutrophils Relative % 62 %   Neutro Abs 4.2 1.7 - 7.7 K/uL   Lymphocytes Relative 29 %   Lymphs Abs 2.0 0.7 - 4.0 K/uL   Monocytes Relative 9 %   Monocytes Absolute 0.6 0.1 - 1.0 K/uL   Eosinophils Relative 0 %   Eosinophils Absolute 0.0 0.0 - 0.7 K/uL   Basophils Relative 0 %   Basophils Absolute 0.0 0.0 - 0.1 K/uL  D-dimer, quantitative (not at [facility])     Status: None   Collection Time: 10/13/15 10:45 PM  Result Value Ref Range   D-Dimer, Quant <0.27 0.00 - 0.50 ug/mL-FEU    Comment: (NOTE) At the manufacturer cut-off of 0.50 ug/mL FEU, this assay has been documented to exclude PE with a sensitivity and negative predictive value of 97 to 99%.  At this time, this assay has not been approved by the FDA to exclude DVT/VTE. Results should be correlated with clinical presentation.   Protime-INR     Status: None   Collection Time: 10/14/15  5:20 AM  Result Value Ref Range   Prothrombin Time 13.6 11.4 - 15.2 seconds   INR 1.04   Lipid panel     Status: Abnormal   Collection Time: 10/14/15  5:20 AM  Result Value Ref Range   Cholesterol 188 0 - 200 mg/dL   Triglycerides 127 <150 mg/dL   HDL 37 (L) >40 mg/dL   Total CHOL/HDL Ratio 5.1 RATIO   VLDL 25 0 - 40 mg/dL   LDL Cholesterol 126 (H) 0 - 99 mg/dL    Comment:        Total Cholesterol/HDL:CHD Risk Coronary Heart Disease Risk Table                     Men   Women  1/2 Average Risk   3.4   3.3  Average Risk       5.0   4.4  2 X Average Risk   9.6   7.1  3 X Average Risk  23.4   11.0        Use the calculated Patient Ratio above and the CHD Risk Table to determine the patient's CHD Risk.  ATP III CLASSIFICATION (LDL):  <100     mg/dL   Optimal  100-129  mg/dL   Near or Above                    Optimal  130-159   mg/dL   Borderline  160-189  mg/dL   High  >190     mg/dL   Very High   Troponin I (q 6hr x 3)     Status: None   Collection Time: 10/14/15  5:20 AM  Result Value Ref Range   Troponin I <0.03 <0.03 ng/mL  Urine rapid drug screen (hosp performed)     Status: None   Collection Time: 10/14/15  8:45 AM  Result Value Ref Range   Opiates NONE DETECTED NONE DETECTED   Cocaine NONE DETECTED NONE DETECTED   Benzodiazepines NONE DETECTED NONE DETECTED   Amphetamines NONE DETECTED NONE DETECTED   Tetrahydrocannabinol NONE DETECTED NONE DETECTED   Barbiturates NONE DETECTED NONE DETECTED    Comment:        DRUG SCREEN FOR MEDICAL PURPOSES ONLY.  IF CONFIRMATION IS NEEDED FOR ANY PURPOSE, NOTIFY LAB WITHIN 5 DAYS.        LOWEST DETECTABLE LIMITS FOR URINE DRUG SCREEN Drug Class       Cutoff (ng/mL) Amphetamine      1000 Barbiturate      200 Benzodiazepine   222 Tricyclics       979 Opiates          300 Cocaine          300 THC              50    Dg Chest 2 View  Result Date: 10/13/2015 CLINICAL DATA:  Central chest pain and pressure. Shortness of breath and lightheadedness. EXAM: CHEST  2 VIEW COMPARISON:  09/09/2015 FINDINGS: Normal heart size and pulmonary vascularity. Slight nodular prominence of the right hilum is probably vascular but hilar lymphadenopathy is not excluded. Suggest CT for further evaluation. No focal airspace disease or consolidation. No blunting of costophrenic angles. No pneumothorax. Mediastinal contours appear intact. IMPRESSION: Nodular prominence of the right hilum is probably vascular but lymphadenopathy is not excluded and CT is suggested for further evaluation. No evidence of active pulmonary disease. Electronically Signed   By: Lucienne Capers M.D.   On: 10/13/2015 23:21   Ct Chest W Contrast  Result Date: 10/14/2015 CLINICAL DATA:  Central chest pain.  Abnormal chest x-ray. EXAM: CT CHEST WITH CONTRAST TECHNIQUE: Multidetector CT imaging of the chest was  performed during intravenous contrast administration. CONTRAST:  133m ISOVUE-300 IOPAMIDOL (ISOVUE-300) INJECTION 61% COMPARISON:  Chest x-ray from earlier today FINDINGS: Cardiovascular: No significant vascular findings. Normal heart size. No pericardial effusion. Mediastinum/Nodes: Other than the nodule abutting the right hilum described below, the mediastinum is otherwise normal. No other suspicious mass or adenopathy. The central great vessels are normal. The heart is normal in size. No effusions. Lungs/Pleura: There is a nodule in the right mid lung on series 2, image 73 measuring 2.5 cm, abutting the hilum. Whether this arises in the lung or the hilum is unclear. A calcified granuloma seen in the right upper lobe on image 65. A tiny nodule in the left lung on image 74 is too small to characterize, along the minor fissure measuring 4 mm. This is visualized on image 74. No other nodules or masses. Upper Abdomen: No acute abnormality. Musculoskeletal: No chest wall abnormality. No acute or significant  osseous findings. IMPRESSION: 1. There is a 2.5 cm nodule abutting the right hilum. Whether this arises from the hilum or in the adjacent lung is unclear. 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 Malignancy not excluded. Recommend pulmonary consultation with consideration of a PET-CT. Electronically Signed   By: Dorise Bullion III M.D   On: 10/14/2015 00:12    ROS: General:no colds or fevers, no weight changes Skin:no rashes or ulcers HEENT:no blurred vision, no congestion CV:see HPI PUL:see HPI GI:no diarrhea constipation or melena, no indigestion GU:no hematuria, no dysuria MS:no joint pain, no claudication Neuro:no syncope, no lightheadedness Endo:no diabetes, no thyroid disease   Blood pressure 136/90, pulse 75, temperature 98.6 F (37 C), temperature source Oral, resp. rate 18, height 5' 10" (1.778 m), weight 255 lb 4.8 oz (115.8 kg), SpO2 100 %.  Wt Readings from Last 3 Encounters:    10/14/15 255 lb 4.8 oz (115.8 kg)    PE: General:Pleasant affect, NAD, anxious but also aware of CT results Skin:Warm and dry, brisk capillary refill HEENT:normocephalic, sclera clear, mucus membranes moist Neck:supple, no JVD, no bruits  Heart:S1S2 RRR without murmur, gallup, rub or click Lungs:clear without rales, rhonchi, or wheezes HFW:YOVZ, non tender, + BS, do not palpate liver spleen or masses Ext:no lower ext edema, 2+ pedal pulses, 2+ radial pulses Neuro:alert and oriented X 3, MAE, follows commands, + facial symmetry    Assessment/Plan Principal Problem:   Chest pain Active Problems:   Hypertension   Lung nodule   SOB (shortness of breath)  Chest pressure neg troponins but ongoing problem and abnormal EKG.  Will check EKG with SOB now.  Oxygen seems to have helped. With family hx he will need cardiac CTA vs. Nuc study or cath.  His symptoms are mostly SOB unsure if nodule seen is cause.  Dr. Sallyanne Kuster to see.   LDL is elevated at 126.     Cecilie Kicks, Nurse Practitioner Certified [place] Pager [phone] or after 5pm or weekends call [phone] 10/14/2015, 11:53 AM    I have seen and examined the patient along with Cecilie Kicks, PA NP.  I have reviewed the chart, notes and new data.  I agree with NP's note.  Key new complaints: no chest pain, has intermittent dyspnea Key examination changes: S4 on exam  Key new findings / data: subtle lateral T wave inversion on ECG, normal enzymes.  PLAN: Coronary CT angio today. Will need further evaluation of right lung abnormality.  Sanda Klein, MD, [place] [phone] 10/14/2015, 11:55 AM

## 2015-10-14 NOTE — ED Notes (Signed)
Report to Chipper Herb on [place]

## 2015-10-14 NOTE — Discharge Summary (Signed)
William [name], is a 45 y.o. male  DOB Oct 22, 1970  MRN [id number].  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 Hypertension     Past Surgical History:  Procedure Laterality Date  . BACK SURGERY         History of present illness and  Hospital Course:     Kindly see H&P for history of present illness and admission details, please review complete Labs, Consult reports and Test reports for all details in brief  HPI  from the history and physical done on the day of admission 10/14/2015  HPI: William [name] is a 44 y.o. male with medical history significant of borderline hypertension, who presents with chest pain.  Patient states that she was waken up with chest pain at about 3 AM yesterday. His chest pain is located in the substernal area, constant, pressure-like, 3-5 out of 10 in severity, nonradiating. It is exertional, aggravated by movement. It is associated with SOB. He has chills, but no fever. [place]. He has left leg cramping pain sometimes, but no tenderness over calf areas. He states that he has mild coughing, which has been going on for  several weeks. He coughs up a little white mucus sometimes. No hemoptysis. No night sweating. No runny nose or sore throat. Patient does not have nausea, vomiting, abdominal pain, symptoms of UTI or unilateral weakness.   Pt sates he had a similar episode a couple months ago and went to [place] where it was suggested that he be admitted but he chose to leave AMA. He reports a family hx of CAD; father had MI at age 48 (still living) and grandfather died from an MI at age 72.   ED Course: pt was found to have negative troponin, WBC 6.8, negative d-dimer, electrolytes and renal function okay, temperature normal, transient oxygen saturation at 91%, currently 98% on room air. Chest x-ray showed nodular prominence of right hilum. CT chest with contrast showed 2.5 cm nodule in the right hilum, suspect neoplasm. Pt is placed on tele bed.   Hospital Course   Chest pain - Cardiology consulted, patient went for coronary CT angiogram, which did show normal coronaries, but mild LVH is most likely due to hypertension and obesity.  Lung nodules - Patient with lung nodule finding on CTA chest, abdomen or consulted, low intermediate risk for lung cancer because of age and nonsmoking history, be followed by pulmonary as an outpatient, be CCM office will call to set up appointment.  Sleep apnea history - PCCM will arrange with a follow-up with one of their sleep specialist as an outpatient    Discharge Condition:  stable   Follow UP  Follow-up Information    SCOTT, William [place], MD. Schedule an appointment as soon as possible for a visit in 1 week(s).   Specialty:  Family Medicine Contact information: [place] [address] [phone]        William [name]., MD .   Specialty:  Pulmonary Disease Why:  you will be contacted with appointment Contact information: [address] [place] [id number] [phone]             Discharge Instructions  and  Discharge  Medications     Discharge Instructions    Diet - low sodium heart healthy    Complete by:  As directed    Increase activity slowly    Complete by:  As directed        Medication List    TAKE these medications   colchicine 0.6 MG tablet Take 0.6 mg by mouth daily as needed (gout flare).   fluticasone 50 MCG/ACT nasal spray Commonly known as:  FLONASE Place 1 spray into both nostrils at bedtime as needed (difficulty breathing due to broken nose).   loratadine 10 MG tablet Commonly known as:  CLARITIN Take 1 tablet (10 mg total) by mouth daily.   omeprazole 40 MG capsule Commonly known as:  PRILOSEC Take 40 mg by mouth daily as needed (heartburn/ eating spicy foods).         Diet and Activity recommendation: See Discharge Instructions above   Consults obtained -  Cardiology Pulmonary   Major procedures and Radiology Reports - PLEASE review detailed and final reports for all details, in brief -      Dg Chest 2 View  Result Date: 10/13/2015 CLINICAL DATA:  Central chest pain and pressure. Shortness of breath and lightheadedness. EXAM: CHEST  2 VIEW COMPARISON:  09/09/2015 FINDINGS: Normal heart size and pulmonary vascularity. Slight nodular prominence of the right hilum is probably vascular but hilar lymphadenopathy is not excluded. Suggest CT for further evaluation. No focal airspace disease or consolidation. No blunting of costophrenic angles. No pneumothorax. Mediastinal contours appear intact. IMPRESSION: Nodular prominence of the right hilum is probably vascular but lymphadenopathy is not excluded and CT is suggested for further evaluation. No evidence of active pulmonary disease. Electronically Signed   By: Lucienne Capers M.D.   On: 10/13/2015 23:21   Ct Chest W Contrast  Result Date: 10/14/2015 CLINICAL DATA:  Central chest pain.  Abnormal chest x-ray. EXAM: CT CHEST WITH CONTRAST TECHNIQUE: Multidetector CT imaging of the chest was performed during  intravenous contrast administration. CONTRAST:  142mL ISOVUE-300 IOPAMIDOL (ISOVUE-300) INJECTION 61% COMPARISON:  Chest x-ray from earlier today FINDINGS: Cardiovascular: No significant vascular findings. Normal heart size. No pericardial effusion. Mediastinum/Nodes: Other than the nodule abutting the right hilum described below, the mediastinum is otherwise normal. No other suspicious mass or adenopathy. The central great vessels are normal. The heart is normal in size. No effusions. Lungs/Pleura: There is a nodule in the right mid lung on series 2, image 73 measuring 2.5 cm, abutting the hilum. Whether this arises in the lung or  the hilum is unclear. A calcified granuloma seen in the right upper lobe on image 65. A tiny nodule in the left lung on image 74 is too small to characterize, along the minor fissure measuring 4 mm. This is visualized on image 74. No other nodules or masses. Upper Abdomen: No acute abnormality. Musculoskeletal: No chest wall abnormality. No acute or significant osseous findings. IMPRESSION: 1. There is a 2.5 cm nodule abutting the right hilum. Whether this arises from the hilum or in the adjacent lung is unclear. 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 Malignancy not excluded. Recommend pulmonary consultation with consideration of a PET-CT. Electronically Signed   By: Dorise Bullion III M.D   On: 10/14/2015 00:12   Ct Coronary Morph W/cta Cor Nancy Fetter W/ca W/cm &/or Wo/cm  Result Date: 10/14/2015 CLINICAL DATA:  45 year old male with SOB, hypertension and family h/o premature CAD. EXAM: Cardiac/Coronary  CT TECHNIQUE: The patient was scanned on a Philips 256 scanner. FINDINGS: A 120 kV prospective scan was triggered in the descending thoracic aorta at 111 HU's. Axial non-contrast 3 mm slices were carried out through the heart. The data set was analyzed on a dedicated work station and scored using the [place]. Gantry rotation speed was 270 msecs and collimation was .9 mm. 5  mg of iv metoprolol and 0.8 mg of sl NTG was given. The 3D data set was reconstructed in 5% intervals of the 67-82 % of the R-R cycle. Diastolic phases were analyzed on a dedicated work station using MPR, MIP and VRT modes. The patient received 80 cc of contrast. Aorta:  Normal size.  No calcifications.  No dissection. Aortic Valve:  Trileaflet.  No calcifications. Coronary Arteries:  Normal coronary origin.  Right dominance. RCA is a large dominant artery that gives rise to PDA and PLVB. There is a motion that affects the read but there is no plaque. Left main is a large artery that gives rise to LAD and LCX arteries. LAD is a medium caliber vessel that gives rise to two small diagonal vessels and has no plaque. LCX is a small non-dominant artery that gives rise to one OM1 branch. There is no plaque. Other findings: Normal pulmonary vein drainage into the left atrium. Normal let atrial appendage without a thrombus. Mild to moderate concentric left ventricular hypertrophy. Upper normal size of the pulmonary artery measuring 30 mm. No ASD or VSD seen. IMPRESSION: 1. Coronary calcium score of 0. This was 0 percentile for age and sex matched control. 2. Normal coronary origin with right dominance. 3. No evidence of CAD. 4. Mild to moderate concentric left ventricular hypertrophy. 5. Upper normal size of the pulmonary artery measuring 30 mm. Ena Dawley Electronically Signed   By: Ena Dawley   On: 10/14/2015 13:34   Ct Maxillofacial Wo Contrast  Result Date: 10/14/2015 CLINICAL DATA:  Pt got hit in nose with baseball 3 months ago and since then pt has had some episodes of SOB and pt states his wife is telling him he is snoring louder. Pt states he feels congested around his nose and pressure around nose and under his eyes. EXAM: CT MAXILLOFACIAL WITHOUT CONTRAST TECHNIQUE: Multidetector CT imaging of the maxillofacial structures was performed. Multiplanar CT image reconstructions were also generated. A  small metallic BB was placed on the right temple in order to reliably differentiate right from left. COMPARISON:  None. FINDINGS: Osseous: No fracture or mandibular dislocation. No destructive process. Orbits: Negative. No traumatic or inflammatory finding. Sinuses: Single  anterior left ethmoid air cell is opacified with mucosal thickening. Small mucous retention cyst in the anterior left sphenoid sinus. 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 Small mucous retention cysts in both maxillary sinuses. Remaining sinuses are clear. Right ostiomeatal complex is narrowed by mucosal thickening aggravated by a right concha bullosa. There is a left concha bullosa without significant left ostiomeatal complex narrowing. Septum is near midline. Clear mastoid air cells and middle ear cavities. Soft tissues: No soft tissue masses. No inflammatory change. No adenopathy. Limited intracranial: Unremarkable. IMPRESSION: 1. No evidence of a recent or older fracture. 2. Mucosal thickening and a right concha bullosa narrows the right ostiomeatal complex. 3. Mild sinus disease as detailed above. No evidence of acute sinusitis. 4. No other abnormalities. Electronically Signed   By: Lajean Manes M.D.   On: 10/14/2015 15:49    Micro Results     No results found for this or any previous visit (from the past 240 hour(s)).     Today   Subjective:   Amari Hawn today has no headache,no new weakness tingling or numbness, feels much better w today, No recurrence of chest pain during hospital stay, no hypoxia, was able to breathe in the hallway on room air 96% saturation.  Objective:   Blood pressure 135/89, pulse 67, temperature 98.4 F (36.9 C), temperature source Oral, resp. rate 18, height 5\' 10"  (1.778 m), weight 115.8 kg (255 lb 4.8 oz), SpO2 100 %.   Intake/Output Summary (Last 24 hours) at 10/14/15 1634 Last data filed at 10/14/15 1000  Gross per 24 hour  Intake              195 ml  Output                 0 ml  Net              195 ml    Exam Awake Alert, Oriented x 3, No new F.N deficits, Normal affect [place].AT,PERRAL Supple Neck,No JVD,  Symmetrical Chest wall movement, Good air movement bilaterally, CTAB RRR,No Gallops,Rubs or new Murmurs, No Parasternal Heave +ve B.Sounds, Abd Soft, Non tender, No organomegaly appriciated, No rebound -guarding or rigidity. No Cyanosis, Clubbing or edema, No new Rash or bruise  Data Review   CBC w Diff: Lab Results  Component Value Date   WBC 6.8 10/13/2015   HGB 15.1 10/13/2015   HCT 44.9 10/13/2015   PLT 173 10/13/2015   LYMPHOPCT 29 10/13/2015   MONOPCT 9 10/13/2015   EOSPCT 0 10/13/2015   BASOPCT 0 10/13/2015    CMP: Lab Results  Component Value Date   NA 139 10/13/2015   K 3.5 10/13/2015   CL 103 10/13/2015   CO2 27 10/13/2015   BUN 12 10/13/2015   CREATININE 1.01 10/13/2015  .   Total Time in preparing paper work, data evaluation and todays exam - 35 minutes  Shanira Tine M.D on 10/14/2015 at 4:34 PM  [facility]   Office  [phone]

## 2015-10-15 LAB — HEMOGLOBIN A1C
Hgb A1c MFr Bld: 5.5 % (ref 4.8–5.6)
MEAN PLASMA GLUCOSE: 111 mg/dL

## 2015-10-16 NOTE — Telephone Encounter (Signed)
Return call  From [facility] can be reached @ [phone].Hillery Hunter

## 2015-10-16 NOTE — Telephone Encounter (Signed)
lmtcb for [employer]

## 2015-10-16 NOTE — Telephone Encounter (Signed)
Called and spoke to [facility] and requested a super D disc on pt's CT chest. They are to call back when this is ready.

## 2015-10-18 ENCOUNTER — Encounter: Payer: Self-pay | Admitting: Emergency Medicine

## 2015-10-18 ENCOUNTER — Ambulatory Visit (INDEPENDENT_AMBULATORY_CARE_PROVIDER_SITE_OTHER): Payer: No Typology Code available for payment source | Admitting: Emergency Medicine

## 2015-10-18 VITALS — BP 138/98 | HR 70 | Ht 70.0 in | Wt 249.0 lb

## 2015-10-18 DIAGNOSIS — R918 Other nonspecific abnormal finding of lung field: Secondary | ICD-10-CM | POA: Diagnosis not present

## 2015-10-18 DIAGNOSIS — Z87898 Personal history of other specified conditions: Secondary | ICD-10-CM | POA: Diagnosis not present

## 2015-10-18 DIAGNOSIS — R0602 Shortness of breath: Secondary | ICD-10-CM | POA: Diagnosis not present

## 2015-10-18 DIAGNOSIS — R911 Solitary pulmonary nodule: Secondary | ICD-10-CM

## 2015-10-18 NOTE — Assessment & Plan Note (Signed)
Undergoing a home sleep study at this time through his PCP

## 2015-10-18 NOTE — Progress Notes (Signed)
Subjective:    Patient ID: William [name], male    DOB: 04-Feb-1970, 45 y.o.   MRN: [id number]  HPI 45 year-old never smoker with history of hypertension. He was seen in the hospital on 10/14/15 to discuss an abnormal CT scan of the chest. He is referred here today to further evaluate.  Apparently has a history of a nasal trauma and fracture with deviation of the nasal bones was evaluated summer 2017 at [facility]. He was admitted for dyspnea that occurred initially at night, ? With some evidence OSA, nasal obstruction. Happens at times when awake. Has made him anxious. Part of the evaluation included a CT chest that I have personally reviewed. Shows a new R hilar 2.5cm mass, etiology unclear.    A CT of the maxillofacial sinuses showed no fracture, mucosal thickening that narrowed the right osteometal complex   Review of Systems As per HPI  Past Medical History:  Diagnosis Date  . Hypertension      Family History  Problem Relation Age of Onset  . Heart attack Father   . Heart attack Paternal Grandfather      Social History   Social History  . Marital status: Married    Spouse name: N/A  . Number of children: N/A  . Years of education: N/A   Occupational History  . Not on file.   Social History Main Topics  . Smoking status: Never Smoker  . Smokeless tobacco: Never Used  . Alcohol use No  . Drug use: Unknown  . Sexual activity: Not on file   Other Topics Concern  . Not on file   Social History Narrative  . 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  Was exposed to chemical smell through work about 3 months ago. Unclear what it was.     No Known Allergies   Outpatient Medications Prior to Visit  Medication Sig Dispense Refill  . colchicine 0.6 MG tablet Take 0.6 mg by mouth daily as needed (gout flare).    Marland Kitchen omeprazole (PRILOSEC) 40 MG capsule Take 40 mg by mouth daily as needed (heartburn/ eating spicy foods).   3  .  fluticasone (FLONASE) 50 MCG/ACT nasal spray Place 1 spray into both nostrils at bedtime as needed (difficulty breathing due to broken nose).   12  . loratadine (CLARITIN) 10 MG tablet Take 1 tablet (10 mg total) by mouth daily. 30 tablet 0   No facility-administered medications prior to visit.          Objective:   Physical Exam Vitals:   10/18/15 1458  BP: (!) 138/98  Pulse: 70  SpO2: 97%  Weight: 249 lb (112.9 kg)  Height: 5\' 10"  (1.778 m)   Gen: Pleasant, well-nourished, in no distress,  normal affect  ENT: No lesions,  mouth clear,  oropharynx clear, no postnasal drip  Neck: No JVD, no TMG, no carotid bruits  Lungs: No use of accessory muscles, no dullness to percussion, clear without rales or rhonchi  Cardiovascular: RRR, heart sounds normal, no murmur or gallops, no peripheral edema  Abdomen: soft and NT, no HSM,  BS normal  Musculoskeletal: No deformities, no cyanosis or clubbing  Neuro: alert, non focal  Skin: Warm, no lesions or rashes     CT chest  10/14/15 --  FINDINGS: Cardiovascular: No significant vascular findings. Normal heart size. No pericardial effusion.  Mediastinum/Nodes: Other than the nodule abutting the right hilum described below, the  mediastinum is otherwise normal. No other suspicious mass or adenopathy. The central great vessels are normal. The heart is normal in size. No effusions.  Lungs/Pleura: There is a nodule in the right mid lung on series 2, image 73 measuring 2.5 cm, abutting the hilum. Whether this arises in the lung or the hilum is unclear. A calcified granuloma seen in the right upper lobe on image 65. A tiny nodule in the left lung on image 74 is too small to characterize, along the minor fissure measuring 4 mm. This is visualized on image 74. No other nodules or masses.  Upper Abdomen: No acute abnormality.  Musculoskeletal: No chest wall abnormality. No acute or significant osseous  findings.  IMPRESSION: 1. There is a 2.5 cm nodule abutting the right hilum. Whether this arises from the hilum or in the adjacent lung is unclear. 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 Malignancy not excluded. Recommend pulmonary consultation with consideration of a PET-CT.     Assessment & Plan:  Lung nodule There is a medial right lower lobe mass right at the fissure with the right middle lobe. Do not see any airways that easily approached the lesion. Its central location would make it difficult to sample via navigational bronchoscopy although certainly we could try to do this. He is low to intermediate risk for malignancy given his never smoking status, negative family history. Do a PET scan to help [place] risk stratify. I will also try to set him up at thoracic conference discussion after the PET scan is done to review possible modalities for biopsy.   History of snoring Undergoing a home sleep study at this time through his PCP  Dyspnea As described this sounds largely due to R nasal obstruction. I believe he needs a 2nd opinion fregarding possible surgery to relieve obstruction. Certainly if his dyspnea persists then we can perform PFT and a full w/u.   [name], MD, PhD 10/18/2015, 3:56 PM [facility] [phone] or if no answer [phone]

## 2015-10-18 NOTE — Assessment & Plan Note (Signed)
As described this sounds largely due to R nasal obstruction. I believe he needs a 2nd opinion fregarding possible surgery to relieve obstruction. Certainly if his dyspnea persists then we can perform PFT and a full w/u.

## 2015-10-18 NOTE — Patient Instructions (Addendum)
We will perform a PET scan to better evaluate your R lung lesion.  We will review your case in Thoracic Conference to decide how to proceed with possible biopsy or procedure.  Get your sleep testing as planned.  I agree that you should consider a second opinion regarding a surgery for nasal obstruction.  Follow with Dr Lamonte Sakai in 3-4 weeks to discuss our findings.

## 2015-10-18 NOTE — Telephone Encounter (Signed)
lmtcb for Chelsey.

## 2015-10-18 NOTE — Assessment & Plan Note (Signed)
There is a medial right lower lobe mass right at the fissure with the right middle lobe. Do not see any airways that easily approached the lesion. Its central location would make it difficult to sample via navigational bronchoscopy although certainly we could try to do this. He is low to intermediate risk for malignancy given his never smoking status, negative family history. Do a PET scan to help [place] risk stratify. I will also try to set him up at thoracic conference discussion after the PET scan is done to review possible modalities for biopsy.

## 2015-10-19 NOTE — Telephone Encounter (Signed)
Pt saw RB on 10/18/15 and a super D disc is not needed. Called and left VM for pt, will need to schedule sleep doc consult.

## 2015-10-22 NOTE — Telephone Encounter (Signed)
Patient returning call - he can be reached at [phone]

## 2015-10-22 NOTE — Telephone Encounter (Signed)
Patient calling back.  Says he had sleep study done at PCP office.  Patient did not want to schedule OV at this time with Sleep doctor.  Patient says that he is scheduled for PET scan on 10/31/15.  FYI to Dr. Lamonte Sakai,

## 2015-10-31 ENCOUNTER — Ambulatory Visit (HOSPITAL_COMMUNITY)
Admission: RE | Admit: 2015-10-31 | Discharge: 2015-10-31 | Disposition: A | Discharge status: Home / Self Care | Payer: No Typology Code available for payment source | Source: Ambulatory Visit | Attending: Emergency Medicine | Admitting: Emergency Medicine

## 2015-10-31 DIAGNOSIS — R918 Other nonspecific abnormal finding of lung field: Secondary | ICD-10-CM | POA: Diagnosis not present

## 2015-10-31 LAB — GLUCOSE, CAPILLARY: Glucose-Capillary: 104 mg/dL — ABNORMAL HIGH (ref 65–99)

## 2015-10-31 MED ORDER — FLUDEOXYGLUCOSE F - 18 (FDG) INJECTION
12.4000 | Freq: Once | INTRAVENOUS | Status: AC | PRN
Start: 2015-10-31 — End: 2015-10-31
  Administered 2015-10-31: 12.4 via INTRAVENOUS

## 2015-11-01 ENCOUNTER — Telehealth: Payer: Self-pay | Admitting: Emergency Medicine

## 2015-11-01 DIAGNOSIS — R911 Solitary pulmonary nodule: Secondary | ICD-10-CM

## 2015-11-01 NOTE — Telephone Encounter (Signed)
Patient called and had additional questions. He can be reached at [phone]

## 2015-11-01 NOTE — Telephone Encounter (Signed)
Answered his questions regarding size, location of lesion. Options for dx and treatment if it were a lung cancer.

## 2015-11-01 NOTE — Telephone Encounter (Signed)
Discussed PET scan results with the patient today. This shows that his right middle lobe nodule is hypermetabolic. There Is an area of hypermetabolism in the cervical lymph node chain. With him that I would like to review the images with thoracic surgery. Biopsing or removing this nodule would be quite complicated due to its location. I can perform bronchoscopy but I'm not sure that there is an airway that goes to the lesion. It may be the safest plan to do this first, but it may be equally beneficial to go directly to surgery. I will review scan w TCTS and call him back

## 2015-11-07 NOTE — Telephone Encounter (Signed)
Patient returning call - he can be reached at [phone]

## 2015-11-07 NOTE — Telephone Encounter (Signed)
lmtcb x1 for pt. 

## 2015-11-07 NOTE — Telephone Encounter (Signed)
Please let the patient know that I have reviewed his films with Thoracic Surgery and that we agreed that the best first step to evaluate his pulmonary nodule will be to perform a biopsy by navigational bronchoscopy (instead of going directly to surgery to take it out). I would like to set this up.   If the patient agrees, please forward this phone note to me so I can order an ENB and attempt to get superD CT scan

## 2015-11-08 NOTE — Telephone Encounter (Signed)
Spoke with pt, states he would like to proceed with biopsy.  Pt notes that he has a pre-op appt with his ENT on 11/7 and is having a sinus procedure on 11/14.    Pt also states he has been given augmentin and prednisone taper to help with the pain in his sinuses- he has started augmentin but is requesting RB's recs regarding prednisone.  Forwarding to RB for recs on prednisone, also at his request stated below regarding biopsy.   Please advise, thanks!

## 2015-11-08 NOTE — Telephone Encounter (Signed)
Spoke with pt. He would like to speak to his wife before making a decision about doing this biopsy. Will await his return call.

## 2015-11-08 NOTE — Telephone Encounter (Signed)
Patient returned call at 5:28pm, call taken by answering service, call back number left was [phone]

## 2015-11-08 NOTE — Telephone Encounter (Signed)
[phone] pt calling back to set up biopsy

## 2015-11-09 NOTE — Telephone Encounter (Signed)
Pt calling back, wanting to know if he needs to keep 11/7 appt with RB.  RB please advise.  Thanks

## 2015-11-09 NOTE — Telephone Encounter (Signed)
Patient called and wants to know if he needs to keep his appt with Dr. Lamonte Sakai on 11/13/15-pr

## 2015-11-09 NOTE — Telephone Encounter (Signed)
lmtcb X1 for pt  

## 2015-11-12 ENCOUNTER — Telehealth: Payer: Self-pay | Admitting: Emergency Medicine

## 2015-11-12 NOTE — Telephone Encounter (Signed)
enb scheduled for 11/21/15@8 :30am pt is aware and still have questions about the antibiotics and prednisone we was to take William [name]

## 2015-11-12 NOTE — Telephone Encounter (Signed)
enb scheduled for 11/21/15@8 :30am lmtcb for pt William [name]

## 2015-11-12 NOTE — Telephone Encounter (Signed)
Discussed the CT scan and PET with Dr Roxan Hockey. Lesion is in a difficult location - renmoval may require bilobectomy or even a pneumoniectomy. We decided to recommend ENB first, even though it is not clear that an airway goes to the lesion. I informed pt of all this today.   We will schedule ENB for nodule biopsy asap, goal next Wed 11/15  He will need a new Ct chest, no contrast, superD cuts with disc to Muriah Harsha  He will discuss these plans with his ENT - his planned nasal sgy may need to be postponed  I told him that we would cancel his appt here tomorrow 11/7.

## 2015-11-12 NOTE — Telephone Encounter (Signed)
CT has been ordered. OV has been canceled for 11/13/2015.

## 2015-11-13 ENCOUNTER — Ambulatory Visit: Payer: No Typology Code available for payment source | Admitting: Emergency Medicine

## 2015-11-14 NOTE — Telephone Encounter (Signed)
Discussed rx for his sinus disease with his wife - he has taken the augmentin, not the pred. I told her that it was ok for him to start the pred and take it as instructed by ENT.

## 2015-11-16 ENCOUNTER — Ambulatory Visit (INDEPENDENT_AMBULATORY_CARE_PROVIDER_SITE_OTHER)
Admission: RE | Admit: 2015-11-16 | Discharge: 2015-11-16 | Disposition: A | Discharge status: Home / Self Care | Payer: No Typology Code available for payment source | Source: Ambulatory Visit | Attending: Emergency Medicine | Admitting: Emergency Medicine

## 2015-11-16 DIAGNOSIS — R911 Solitary pulmonary nodule: Secondary | ICD-10-CM | POA: Diagnosis not present

## 2015-11-16 NOTE — Pre-Procedure Instructions (Signed)
    William [name]  11/16/2015    Your procedure is scheduled on Wednesday, November 15.  Report to [facility] Admitting at 6:30 AM                 Your surgery or procedure is scheduled for 8:30 AM   Call this number if you have problems the morning of surgery:[phone]               For any other questions, please call [phone], Monday - Friday 8 AM - 4 PM.     Remember:  Do not eat food or drink liquids after midnight: Tuesday, November 14.  Take these medicines the morning of surgery with A SIP OF WATER:omeprazole ([place]).   May use Flonase.                 Take if needed: colchicine                    Stop taking Aspirin, Aspirin Products, Vitamins and herbal medications.  Do not take any NSAIDS ie:  Ibuprofen, Advil, Naproxen.         Do not wear jewelry, make-up or nail polish.  Do not wear lotions, powders, or perfumes, or deodorant.   Men may shave face and neck.  Do not bring valuables to the hospital.  [facility] is not responsible for any belongings or valuables.  Contacts, dentures or bridgework may not be worn into surgery.  Leave your suitcase in the car.  After surgery it may be brought to your room.  For patients admitted to the hospital, discharge time will be determined by your treatment team.  Patients discharged the day of surgery will not be allowed to drive home.    Special instructions:  Review  [place] - Preparing For Surgery.  Please read over the following fact sheets that you were given: [facility]- Preparing For Surgery, Coughing and Deep Breathing and

## 2015-11-19 ENCOUNTER — Encounter (HOSPITAL_COMMUNITY)
Admission: RE | Admit: 2015-11-19 | Discharge: 2015-11-19 | Disposition: A | Discharge status: Home / Self Care | Payer: No Typology Code available for payment source | Source: Ambulatory Visit | Attending: Emergency Medicine | Admitting: Emergency Medicine

## 2015-11-19 ENCOUNTER — Encounter (HOSPITAL_COMMUNITY): Payer: Self-pay

## 2015-11-19 DIAGNOSIS — Z7952 Long term (current) use of systemic steroids: Secondary | ICD-10-CM | POA: Diagnosis not present

## 2015-11-19 DIAGNOSIS — R911 Solitary pulmonary nodule: Secondary | ICD-10-CM | POA: Diagnosis not present

## 2015-11-19 DIAGNOSIS — Z79899 Other long term (current) drug therapy: Secondary | ICD-10-CM | POA: Diagnosis not present

## 2015-11-19 DIAGNOSIS — I10 Essential (primary) hypertension: Secondary | ICD-10-CM | POA: Diagnosis not present

## 2015-11-19 DIAGNOSIS — G4733 Obstructive sleep apnea (adult) (pediatric): Secondary | ICD-10-CM | POA: Diagnosis not present

## 2015-11-19 HISTORY — DX: Anxiety disorder, unspecified: F41.9

## 2015-11-19 HISTORY — DX: Deviated nasal septum: J34.2

## 2015-11-19 LAB — CBC
HCT: 47.3 % (ref 39.0–52.0)
Hemoglobin: 15.6 g/dL (ref 13.0–17.0)
MCH: 29.2 pg (ref 26.0–34.0)
MCHC: 33 g/dL (ref 30.0–36.0)
MCV: 88.6 fL (ref 78.0–100.0)
PLATELETS: 174 10*3/uL (ref 150–400)
RBC: 5.34 MIL/uL (ref 4.22–5.81)
RDW: 12.4 % (ref 11.5–15.5)
WBC: 8.8 10*3/uL (ref 4.0–10.5)

## 2015-11-19 LAB — BASIC METABOLIC PANEL
Anion gap: 8 (ref 5–15)
BUN: 14 mg/dL (ref 6–20)
CHLORIDE: 107 mmol/L (ref 101–111)
CO2: 26 mmol/L (ref 22–32)
CREATININE: 1.03 mg/dL (ref 0.61–1.24)
Calcium: 9.1 mg/dL (ref 8.9–10.3)
GFR calc Af Amer: >60 mL/min (ref >60)
GFR calc non Af Amer: >60 mL/min (ref >60)
Glucose, Bld: 92 mg/dL (ref 65–99)
Potassium: 3.8 mmol/L (ref 3.5–5.1)
SODIUM: 141 mmol/L (ref 135–145)

## 2015-11-19 NOTE — Progress Notes (Signed)
Mr William [name] has a history of sleep apnea. PCP is at [facility] in [place], records requested.  Patient reports that he had a stress test at [facility] a few years ago, not sure why, but he did not require follow up. I requested records from [facility].

## 2015-11-21 ENCOUNTER — Ambulatory Visit (HOSPITAL_COMMUNITY): Payer: No Typology Code available for payment source | Admitting: Certified Registered Nurse Anesthetist

## 2015-11-21 ENCOUNTER — Ambulatory Visit (HOSPITAL_COMMUNITY)
Admission: RE | Admit: 2015-11-21 | Discharge: 2015-11-21 | Disposition: A | Discharge status: Home / Self Care | Payer: No Typology Code available for payment source | Source: Ambulatory Visit | Attending: Emergency Medicine | Admitting: Emergency Medicine

## 2015-11-21 ENCOUNTER — Encounter (HOSPITAL_COMMUNITY): Payer: Self-pay | Admitting: *Deleted

## 2015-11-21 ENCOUNTER — Ambulatory Visit (HOSPITAL_COMMUNITY): Payer: No Typology Code available for payment source

## 2015-11-21 ENCOUNTER — Encounter (HOSPITAL_COMMUNITY)
Admission: RE | Disposition: A | Discharge status: Home / Self Care | Payer: Self-pay | Source: Ambulatory Visit | Attending: Emergency Medicine

## 2015-11-21 DIAGNOSIS — R911 Solitary pulmonary nodule: Secondary | ICD-10-CM | POA: Diagnosis not present

## 2015-11-21 DIAGNOSIS — Z7952 Long term (current) use of systemic steroids: Secondary | ICD-10-CM | POA: Insufficient documentation

## 2015-11-21 DIAGNOSIS — R918 Other nonspecific abnormal finding of lung field: Secondary | ICD-10-CM | POA: Diagnosis not present

## 2015-11-21 DIAGNOSIS — Z419 Encounter for procedure for purposes other than remedying health state, unspecified: Secondary | ICD-10-CM

## 2015-11-21 DIAGNOSIS — Z79899 Other long term (current) drug therapy: Secondary | ICD-10-CM | POA: Insufficient documentation

## 2015-11-21 DIAGNOSIS — Z9889 Other specified postprocedural states: Secondary | ICD-10-CM

## 2015-11-21 DIAGNOSIS — I10 Essential (primary) hypertension: Secondary | ICD-10-CM | POA: Insufficient documentation

## 2015-11-21 DIAGNOSIS — G4733 Obstructive sleep apnea (adult) (pediatric): Secondary | ICD-10-CM | POA: Insufficient documentation

## 2015-11-21 HISTORY — PX: ENDOBRONCHIAL ULTRASOUND: SHX5096

## 2015-11-21 HISTORY — PX: VIDEO BRONCHOSCOPY WITH ENDOBRONCHIAL NAVIGATION: SHX6175

## 2015-11-21 SURGERY — VIDEO BRONCHOSCOPY WITH ENDOBRONCHIAL NAVIGATION
Anesthesia: General

## 2015-11-21 MED ORDER — HYDROMORPHONE HCL 1 MG/ML IJ SOLN: 0.2500 mg | INTRAMUSCULAR | Status: DC | PRN

## 2015-11-21 MED ORDER — EPINEPHRINE PF 1 MG/ML IJ SOLN
INTRAMUSCULAR | Status: AC
  Filled 2015-11-21: qty 1

## 2015-11-21 MED ORDER — LIDOCAINE 2% (20 MG/ML) 5 ML SYRINGE
INTRAMUSCULAR | Status: AC
  Filled 2015-11-21: qty 10

## 2015-11-21 MED ORDER — LIDOCAINE HCL (CARDIAC) 20 MG/ML IV SOLN
INTRAVENOUS | Status: DC | PRN
  Administered 2015-11-21: 100 mg via INTRAVENOUS

## 2015-11-21 MED ORDER — SUCCINYLCHOLINE CHLORIDE 20 MG/ML IJ SOLN
INTRAMUSCULAR | Status: DC | PRN
  Administered 2015-11-21: 140 mg via INTRAVENOUS

## 2015-11-21 MED ORDER — PROPOFOL 10 MG/ML IV BOLUS
INTRAVENOUS | Status: DC | PRN
  Administered 2015-11-21: 200 mg via INTRAVENOUS

## 2015-11-21 MED ORDER — 0.9 % SODIUM CHLORIDE (POUR BTL) OPTIME
TOPICAL | Status: DC | PRN
  Administered 2015-11-21: 1000 mL

## 2015-11-21 MED ORDER — ONDANSETRON HCL 4 MG/2ML IJ SOLN
INTRAMUSCULAR | Status: DC | PRN
  Administered 2015-11-21: 4 mg via INTRAVENOUS

## 2015-11-21 MED ORDER — MIDAZOLAM HCL 5 MG/5ML IJ SOLN
INTRAMUSCULAR | Status: DC | PRN
  Administered 2015-11-21: 2 mg via INTRAVENOUS

## 2015-11-21 MED ORDER — ROCURONIUM BROMIDE 100 MG/10ML IV SOLN
INTRAVENOUS | Status: DC | PRN
  Administered 2015-11-21: 40 mg via INTRAVENOUS
  Administered 2015-11-21: 10 mg via INTRAVENOUS

## 2015-11-21 MED ORDER — PHENYLEPHRINE 40 MCG/ML (10ML) SYRINGE FOR IV PUSH (FOR BLOOD PRESSURE SUPPORT)
PREFILLED_SYRINGE | INTRAVENOUS | Status: AC
  Filled 2015-11-21: qty 10

## 2015-11-21 MED ORDER — ONDANSETRON HCL 4 MG/2ML IJ SOLN
INTRAMUSCULAR | Status: AC
  Filled 2015-11-21: qty 2

## 2015-11-21 MED ORDER — SUGAMMADEX SODIUM 200 MG/2ML IV SOLN
INTRAVENOUS | Status: DC | PRN
  Administered 2015-11-21: 300 mg via INTRAVENOUS

## 2015-11-21 MED ORDER — PROPOFOL 10 MG/ML IV BOLUS
INTRAVENOUS | Status: AC
  Filled 2015-11-21: qty 20

## 2015-11-21 MED ORDER — FENTANYL CITRATE (PF) 100 MCG/2ML IJ SOLN
INTRAMUSCULAR | Status: DC | PRN
  Administered 2015-11-21 (×2): 50 ug via INTRAVENOUS

## 2015-11-21 MED ORDER — MIDAZOLAM HCL 2 MG/2ML IJ SOLN
INTRAMUSCULAR | Status: AC
  Filled 2015-11-21: qty 2

## 2015-11-21 MED ORDER — DEXAMETHASONE SODIUM PHOSPHATE 10 MG/ML IJ SOLN
INTRAMUSCULAR | Status: DC | PRN
  Administered 2015-11-21: 10 mg via INTRAVENOUS

## 2015-11-21 MED ORDER — LACTATED RINGERS IV SOLN
INTRAVENOUS | Status: DC | PRN
  Administered 2015-11-21: 08:00:00 via INTRAVENOUS

## 2015-11-21 MED ORDER — FENTANYL CITRATE (PF) 100 MCG/2ML IJ SOLN
INTRAMUSCULAR | Status: AC
  Filled 2015-11-21: qty 4

## 2015-11-21 SURGICAL SUPPLY — 44 items
ADAPTER BRONCH F/PENTAX (ADAPTER) ×3 IMPLANT
ADPR BSCP EDG PNTX (ADAPTER) ×1
BRUSH CYTOL CELLEBRITY 1.5X140 (MISCELLANEOUS) ×3 IMPLANT
BRUSH SUPERTRAX BIOPSY (INSTRUMENTS) IMPLANT
BRUSH SUPERTRAX NDL-TIP CYTO (INSTRUMENTS) ×2 IMPLANT
CANISTER SUCTION 2500CC (MISCELLANEOUS) ×3 IMPLANT
CHANNEL WORK EXTEND EDGE 180 (KITS) IMPLANT
CHANNEL WORK EXTEND EDGE 45 (KITS) IMPLANT
CHANNEL WORK EXTEND EDGE 90 (KITS) IMPLANT
CONT SPEC 4OZ CLIKSEAL STRL BL (MISCELLANEOUS) ×3 IMPLANT
COVER TABLE BACK 60X90 (DRAPES) ×3 IMPLANT
FILTER STRAW FLUID ASPIR (MISCELLANEOUS) IMPLANT
FORCEPS BIOP SUPERTRX PREMAR (INSTRUMENTS) ×2 IMPLANT
GAUZE SPONGE 4X4 12PLY STRL (GAUZE/BANDAGES/DRESSINGS) ×3 IMPLANT
GLOVE BIO SURGEON STRL SZ7.5 (GLOVE) ×6 IMPLANT
GLOVE SURG SS PI 7.0 STRL IVOR (GLOVE) ×2 IMPLANT
GOWN STRL REUS W/ TWL LRG LVL3 (GOWN DISPOSABLE) IMPLANT
GOWN STRL REUS W/ TWL XL LVL3 (GOWN DISPOSABLE) IMPLANT
GOWN STRL REUS W/TWL LRG LVL3 (GOWN DISPOSABLE) ×3
GOWN STRL REUS W/TWL XL LVL3 (GOWN DISPOSABLE) ×3
KIT CLEAN ENDO COMPLIANCE (KITS) ×3 IMPLANT
KIT LOCATABLE GUIDE (CANNULA) IMPLANT
KIT MARKER FIDUCIAL DELIVERY (KITS) IMPLANT
KIT PROCEDURE EDGE 180 (KITS) IMPLANT
KIT PROCEDURE EDGE 45 (KITS) IMPLANT
KIT PROCEDURE EDGE 90 (KITS) IMPLANT
KIT ROOM TURNOVER OR (KITS) ×3 IMPLANT
MARKER SKIN DUAL TIP RULER LAB (MISCELLANEOUS) ×3 IMPLANT
NDL EBUS SONO TIP PENTAX (NEEDLE) IMPLANT
NDL SUPERTRX PREMARK BIOPSY (NEEDLE) IMPLANT
NEEDLE EBUS SONO TIP PENTAX (NEEDLE) ×3 IMPLANT
NEEDLE SUPERTRX PREMARK BIOPSY (NEEDLE) ×3 IMPLANT
NS IRRIG 1000ML POUR BTL (IV SOLUTION) ×3 IMPLANT
OIL SILICONE PENTAX (PARTS (SERVICE/REPAIRS)) ×3 IMPLANT
PAD ARMBOARD 7.5X6 YLW CONV (MISCELLANEOUS) ×6 IMPLANT
PATCHES PATIENT (LABEL) ×3 IMPLANT
SYR 20CC LL (SYRINGE) ×3 IMPLANT
SYR 20ML ECCENTRIC (SYRINGE) ×5 IMPLANT
SYR 50ML SLIP (SYRINGE) ×3 IMPLANT
TOWEL OR 17X24 6PK STRL BLUE (TOWEL DISPOSABLE) ×3 IMPLANT
TRAP SPECIMEN MUCOUS 40CC (MISCELLANEOUS) IMPLANT
TUBE CONNECTING 20'X1/4 (TUBING) ×1
TUBE CONNECTING 20X1/4 (TUBING) ×2 IMPLANT
WATER STERILE IRR 1000ML POUR (IV SOLUTION) ×3 IMPLANT

## 2015-11-21 NOTE — Op Note (Signed)
Video Bronchoscopy with Electromagnetic Navigation and Endobronchial Ultrasound Procedure Note  Date of Operation: 11/21/2015  Pre-op Diagnosis: R hilar hypermetabolic nodule  Post-op Diagnosis: same  Surgeon: [name]  Assistants: none  Anesthesia: General endotracheal anesthesia  Operation: Flexible video fiberoptic bronchoscopy with electromagnetic navigation and biopsies.  Estimated Blood Loss: none  Complications: none apparent  Indications and History: William [name] is a 45 y.o. male never smoker with a R hilar nodule that was spuriously found on CT chest. PET scan confirmed that it was hypermetabolic. Although its location made it difficult to approach, it was felt that most appropriate first step would be to attempt tissue diagnosis via navigational bronchoscopy. The risks, benefits, complications, treatment options and expected outcomes were discussed with the patient.  The possibilities of pneumothorax, pneumonia, reaction to medication, pulmonary aspiration, perforation of a viscus, bleeding, failure to diagnose a condition and creating a complication requiring transfusion or operation were discussed with the patient who freely signed the consent.    Description of Procedure: The patient was seen in the Preoperative Area, was examined and was deemed appropriate to proceed.  The patient was taken to OR10, identified as William [name] and the procedure verified as Flexible Video Fiberoptic Bronchoscopy.  A Time Out was held and the above information confirmed.   Prior to the date of the procedure a high-resolution CT scan of the chest was performed. Utilizing [place] a virtual tracheobronchial tree was generated to allow the creation of distinct navigation pathways to the patient's R hilar mass. After being taken to the operating room general anesthesia was initiated and the patient  was orally intubated. The video fiberoptic bronchoscope was introduced via the  endotracheal tube and a general inspection was performed which showed normal airways throughout. The extendable working channel and locator guide were introduced into the bronchoscope. The distinct navigation pathways prepared prior to this procedure were then utilized to navigate to the nodule identified on CT scan. Pathways via the RUL bronchi and RML bronchi were explored. Both paths extended out past the lesion with no means to direct towards it. Based on the central location of the R hilar nodule on navigation it was felt likely that it would be visible via ultrasound. For this reason EBUS was performed to better localize and to attempt Wang needle biopsies. Using EBUS I was able to find the nodule while visualizing from both the RML and RLL bronchi. Unfortunately, as expected based on the appearance on CT scan, the nodule was situated directly behind the pulmonary artery. I could not find any approach to the nodule that would not impact the pulmonary artery. Photos were taken to define it's location. No bx's were attempted given the anatomy described above. At the end of the procedure a general airway inspection was performed and there was no evidence of active bleeding. The bronchoscope was removed.  The patient tolerated the procedure well. There was no significant blood loss and there were no obvious complications. A post-procedural chest x-ray is pending.  Anesthesia was reversed and the patient was taken to the PACU for recovery.   Samples: None  Plans:  The patient will be discharged from the PACU to home when recovered from anesthesia. I will have to revisit the case with Thoracic Surgery. Expect that we will decide to either watch with serial films versus go directly to a more extensive surgery to get a diagnosis and hopefully to definitively treat. Even if we decide to wait and watch, he will  likely ultimately require resection.  Outpatient followup will be with Dr Lamonte Sakai  .  [name], MD,  PhD 11/21/2015, 9:45 AM [facility] [phone] or if no answer [phone]

## 2015-11-21 NOTE — Anesthesia Postprocedure Evaluation (Signed)
Anesthesia Post Note  Patient: William [name]  Procedure(s) Performed: Procedure(s) (LRB): VIDEO BRONCHOSCOPY WITH ENDOBRONCHIAL NAVIGATION (N/A) ENDOBRONCHIAL ULTRASOUND (N/A)  Patient location during evaluation: PACU Anesthesia Type: General Level of consciousness: awake Pain management: pain level controlled Vital Signs Assessment: post-procedure vital signs reviewed and stable Respiratory status: spontaneous breathing Cardiovascular status: stable Anesthetic complications: no    Last Vitals:  Vitals:   11/21/15 1030 11/21/15 1035  BP:  (!) 132/93  Pulse: (!) 59 69  Resp: 18 16  Temp: 36.7 C     Last Pain:  Vitals:   11/21/15 0726  TempSrc: Oral                 EDWARDS,Jazmaine Fuelling

## 2015-11-21 NOTE — Progress Notes (Signed)
Dr. Lamonte Sakai at bedside. Spoke extensively with pt about intra-op findings and post-op plans. CXR d/c'd per MD order.

## 2015-11-21 NOTE — Anesthesia Preprocedure Evaluation (Addendum)
Anesthesia Evaluation  Patient identified by MRN, date of birth, ID band Patient awake    Reviewed: Allergy & Precautions, NPO status , Patient's Chart, lab work & pertinent test results  History of Anesthesia Complications (+) PROLONGED EMERGENCE  Airway Mallampati: II  TM Distance: <3 FB     Dental   Pulmonary shortness of breath,    breath sounds clear to auscultation       Cardiovascular hypertension,  Rhythm:Regular Rate:Normal     Neuro/Psych    GI/Hepatic negative GI ROS, Neg liver ROS,   Endo/Other  negative endocrine ROS  Renal/GU negative Renal ROS     Musculoskeletal   Abdominal   Peds  Hematology   Anesthesia Other Findings   Reproductive/Obstetrics                           Anesthesia Physical Anesthesia Plan  ASA: III  Anesthesia Plan: General   Post-op Pain Management:    Induction: Intravenous  Airway Management Planned: Oral ETT  Additional Equipment:   Intra-op Plan:   Post-operative Plan: Possible Post-op intubation/ventilation  Informed Consent: I have reviewed the patients History and Physical, chart, labs and discussed the procedure including the risks, benefits and alternatives for the proposed anesthesia with the patient or authorized representative who has indicated his/her understanding and acceptance.   Dental advisory given  Plan Discussed with: CRNA and Anesthesiologist  Anesthesia Plan Comments:         Anesthesia Quick Evaluation

## 2015-11-21 NOTE — Discharge Instructions (Signed)
Flexible Bronchoscopy, Care After These instructions give you information on caring for yourself after your procedure. Your doctor may also give you more specific instructions. Call your doctor if you have any problems or questions after your procedure. Follow these instructions at home:  Do not eat or drink anything for 2 hours after your procedure. If you try to eat or drink before the medicine wears off, food or drink could go into your lungs. You could also burn yourself.  After 2 hours have passed and when you can cough and gag normally, you may eat soft food and drink liquids slowly.  The day after the test, you may eat your normal diet.  You may do your normal activities.  Keep all doctor visits. Get help right away if:  You get more and more short of breath.  You get light-headed.  You feel like you are going to pass out (faint).  You have chest pain.  You have new problems that worry you.  You cough up more than a little blood.  You cough up more blood than before.   PLEASE CALL OUR OFFICE FOR ANY PROBLEMS OR QUESTIONS. [phone]   This information is not intended to replace advice given to you by your health care provider. Make sure you discuss any questions you have with your health care provider. Document Released: 10/20/2008 Document Revised: 05/31/2015 Document Reviewed: 08/27/2012 Elsevier Interactive Patient Education  2017 [employer].

## 2015-11-21 NOTE — Transfer of Care (Signed)
Immediate Anesthesia Transfer of Care Note  Patient: William [name]  Procedure(s) Performed: Procedure(s): VIDEO BRONCHOSCOPY WITH ENDOBRONCHIAL NAVIGATION (N/A) ENDOBRONCHIAL ULTRASOUND (N/A)  Patient Location: PACU  Anesthesia Type:General  Level of Consciousness: awake and alert   Airway & Oxygen Therapy: Patient Spontanous Breathing and Patient connected to face mask oxygen  Post-op Assessment: Report given to RN, Post -op Vital signs reviewed and stable and Patient moving all extremities X 4  Post vital signs: Reviewed and stable  Last Vitals:  Vitals:   11/21/15 0726 11/21/15 0946  BP: 138/84 133/87  Pulse: 66 61  Resp: 20 15  Temp: 37.1 C 36.4 C    Last Pain:  Vitals:   11/21/15 0726  TempSrc: Oral      Patients Stated Pain Goal: 1 ([id number])  Complications: No apparent anesthesia complications

## 2015-11-21 NOTE — H&P (Signed)
William [name] is an 45 y.o. male.   Chief Complaint: R perihilar mass HPI:  HPI 45 year-old never smoker with history of hypertension. He was seen in the hospital on 10/14/15 to discuss an abnormal CT scan of the chest. He has a history of a nasal trauma and fracture with deviation of the nasal bones was evaluated summer 2017 at [facility]. He was admitted for dyspnea that occurred initially at night, ? With some evidence OSA, nasal obstruction. Happens at times when awake. Has made him anxious. Part of the evaluation included a CT chest that I have personally reviewed. Shows a new R hilar 2.5cm mass, etiology unclear. He has been dx with OSA and has started CPAP therapy. His sinus surgery has been deferred pending the eval of the lung lesion. PET scan on 10/31/15 showed lesion was hypermetabolic. I have reviewed the films with Thoracic Surgery and at thoracic conference. Unfortunately the lesions location at the junction of all 3 R lung lobes makes it very difficult to reach. Even surgery would potentially be extensive, possibly pneumonectomy. We have decided to attempt bx via ENB in hopes that we can get a less invasive tissue dx. He presents for this today. No new issues reported. He is anxious about the mass and the procedure.     Past Medical History:  Diagnosis Date  . Anxiety    situational  . Deviated nasal septum   . Hypertension     Past Surgical History:  Procedure Laterality Date  . BACK SURGERY  2002ish   moicrodisectomy    Family History  Problem Relation Age of Onset  . Heart attack Father   . Heart attack Paternal Grandfather    Social History:  reports that he has never smoked. He has never used smokeless tobacco. He reports that he does not drink alcohol or use drugs.  Allergies: No Known Allergies  Medications Prior to Admission  Medication Sig Dispense Refill  . amoxicillin-clavulanate (AUGMENTIN) 875-125 MG tablet Take 1 tablet by mouth 2 (two) times daily.  0  .  fluticasone (FLONASE) 50 MCG/ACT nasal spray Place 1-2 sprays into both nostrils daily.  0  . omeprazole (PRILOSEC) 40 MG capsule Take 40 mg by mouth daily as needed (heartburn/ eating spicy foods).   3  . OVER THE COUNTER MEDICATION Vitamin b as needed    . predniSONE (STERAPRED UNI-PAK 21 TAB) 5 MG (21) TBPK tablet Take 5 mg by mouth daily.    . colchicine 0.6 MG tablet Take 0.6 mg by mouth daily as needed (gout flare).      Results for orders placed or performed during the hospital encounter of 11/19/15 (from the past 48 hour(s))  Basic metabolic panel     Status: None   Collection Time: 11/19/15  9:51 AM  Result Value Ref Range   Sodium 141 135 - 145 mmol/L   Potassium 3.8 3.5 - 5.1 mmol/L   Chloride 107 101 - 111 mmol/L   CO2 26 22 - 32 mmol/L   Glucose, Bld 92 65 - 99 mg/dL   BUN 14 6 - 20 mg/dL   Creatinine, Ser 1.03 0.61 - 1.24 mg/dL   Calcium 9.1 8.9 - 10.3 mg/dL   GFR calc non Af Amer >60 >60 mL/min   GFR calc Af Amer >60 >60 mL/min    Comment: (NOTE) The eGFR has been calculated using the CKD EPI equation. This calculation has not been validated in all clinical situations. eGFR's persistently <60 mL/min signify  possible Chronic Kidney Disease.    Anion gap 8 5 - 15  CBC     Status: None   Collection Time: 11/19/15  9:51 AM  Result Value Ref Range   WBC 8.8 4.0 - 10.5 K/uL   RBC 5.34 4.22 - 5.81 MIL/uL   Hemoglobin 15.6 13.0 - 17.0 g/dL   HCT 47.3 39.0 - 52.0 %   MCV 88.6 78.0 - 100.0 fL   MCH 29.2 26.0 - 34.0 pg   MCHC 33.0 30.0 - 36.0 g/dL   RDW 12.4 11.5 - 15.5 %   Platelets 174 150 - 400 K/uL   No results found.  ROS Anxious regarding the procedure, no other issues reported   Blood pressure 138/84, pulse 66, temperature 98.8 F (37.1 C), temperature source Oral, resp. rate 20, height _0  (1.778 m), weight 111.6 kg (246 lb), SpO2 100 %. Physical Exam  Gen: Pleasant, well-nourished, in no distress,  normal affect  ENT: No lesions,  mouth clear,   oropharynx clear, no postnasal drip  Neck: No JVD, no TMG, no carotid bruits  Lungs: No use of accessory muscles, clear without rales or rhonchi  Cardiovascular: RRR, heart sounds normal, no murmur or gallops, no peripheral edema  Musculoskeletal: No deformities, no cyanosis or clubbing  Neuro: alert, non focal  Skin: Warm, no lesions or rashes   Assessment/Plan R perihilar lung mass  Will proceed with navigational bronchoscopy to attempt needle biopsies of the mass. No barriers. Pt understands the plans.    Collene Gobble., MD 11/21/2015, 8:29 AM

## 2015-11-21 NOTE — Anesthesia Procedure Notes (Addendum)
Procedure Name: Intubation Date/Time: 11/21/2015 8:48 AM Performed by: Rejeana Brock L Pre-anesthesia Checklist: Patient identified, Emergency Drugs available, Suction available and Patient being monitored Patient Re-evaluated:Patient Re-evaluated prior to inductionOxygen Delivery Method: Circle System Utilized Preoxygenation: Pre-oxygenation with 100% oxygen Intubation Type: IV induction Ventilation: Mask ventilation without difficulty, Oral airway inserted - appropriate to patient size and Two handed mask ventilation required Laryngoscope Size: Glidescope and 4 Grade View: Grade I Tube type: Oral Tube size: 8.5 mm Number of attempts: 1 Airway Equipment and Method: Stylet and Oral airway Placement Confirmation: ETT inserted through vocal cords under direct vision,  positive ETCO2 and breath sounds checked- equal and bilateral Secured at: 23 cm Tube secured with: Tape Dental Injury: Teeth and Oropharynx as per pre-operative assessment  Difficulty Due To: Difficulty was anticipated and Difficult Airway- due to anterior larynx Comments: DL x 1, esophageal intubation, glidescope MAC 4 smooth intubation, grade 1

## 2015-11-22 ENCOUNTER — Encounter (HOSPITAL_COMMUNITY): Payer: Self-pay | Admitting: Emergency Medicine

## 2015-12-07 NOTE — Telephone Encounter (Signed)
Spoke with pt's wife Leafy Ro, requesting that Clatonia call her to discuss further care for pt.  Pt verified below number as best callback number.  RB please advise.  Thanks!

## 2015-12-07 NOTE — Telephone Encounter (Signed)
Patient wife called wanting to RB to call her regarding the next steps in patient's care. She can be reached at [phone] -pr

## 2015-12-07 NOTE — Telephone Encounter (Signed)
atc pt's wife, line rang several times, automated line stated "all circuits are busy, please call back later".  Wcb.

## 2015-12-07 NOTE — Telephone Encounter (Signed)
I discussed his case with Dr Roxan Hockey and we agreed that he should see Dr Lemmie Evens to discuss next steps. I though this referral had already been made - please set him up to see Dr Roxan Hockey with [facility], let her know that they will be contacted with an appointment

## 2015-12-18 ENCOUNTER — Telehealth: Payer: Self-pay | Admitting: Emergency Medicine

## 2015-12-18 NOTE — Telephone Encounter (Signed)
I was speaking with the pt and phones were d/ced

## 2015-12-19 NOTE — Telephone Encounter (Signed)
[email or web address] x1 

## 2015-12-20 NOTE — Telephone Encounter (Signed)
[phone] calling back

## 2015-12-20 NOTE — Telephone Encounter (Signed)
LMTCB

## 2015-12-21 ENCOUNTER — Ambulatory Visit: Payer: No Typology Code available for payment source | Admitting: Emergency Medicine

## 2015-12-21 ENCOUNTER — Other Ambulatory Visit: Payer: Self-pay | Admitting: *Deleted

## 2015-12-21 DIAGNOSIS — R911 Solitary pulmonary nodule: Secondary | ICD-10-CM

## 2015-12-21 MED ORDER — ALPRAZOLAM 0.5 MG PO TABS: 0.5000 mg | ORAL_TABLET | Freq: Two times a day (BID) | ORAL | 0 refills | Status: DC | PRN

## 2015-12-21 NOTE — Telephone Encounter (Signed)
Spoke with the patient regarding his lung nodule, plans for further evaluation. He understands that he may ultimately need a pneumonectomy. Very stress-provoking for him, understandable concerns about what this will mean for his functional capacity. He is equally concerned that the nodule could be malignant. I believe it is likely a carcinoid, but have been unable to get tissue due to it's location.   William [name] agrees to referral to Dr Roxan Hockey to discuss next steps. I will give short term xanax to help his progressive anxiety - believe this is all situational.

## 2016-01-03 ENCOUNTER — Institutional Professional Consult (permissible substitution) (INDEPENDENT_AMBULATORY_CARE_PROVIDER_SITE_OTHER): Payer: No Typology Code available for payment source | Admitting: Thoracic Surgery (Cardiothoracic Vascular Surgery)

## 2016-01-03 ENCOUNTER — Encounter: Payer: Self-pay | Admitting: Thoracic Surgery (Cardiothoracic Vascular Surgery)

## 2016-01-03 VITALS — BP 135/95 | HR 83 | Resp 16 | Ht 70.0 in | Wt 240.0 lb

## 2016-01-03 DIAGNOSIS — R911 Solitary pulmonary nodule: Secondary | ICD-10-CM | POA: Diagnosis not present

## 2016-01-03 NOTE — Progress Notes (Signed)
PCP is Ann Held, MD Referring Provider is Collene Gobble, MD  Chief Complaint  Patient presents with  . Lung Lesion    right mid lung.Marland KitchenMarland KitchenCT CHEST 10/13/15, SUPER- D CT CHEST 11/16/15, PET 10/31/15    HPI: William [name] is a 45 year old gentleman sent for consultation regarding a right hilar mass.  William [name] was hit in the nose and face with a baseball earlier this year. Shortly after that he went to the emergency room with trouble breathing. During his evaluation he had a chest x-ray which showed a "spot" on the lung. A CT of the chest showed a 2.5 cm right hilar mass at the confluence of the major minor fissures. He was referred to William [name]. William [name] did a PET/CT which showed the lesion was hypermetabolic. He attempted navigational bronchoscopy and endobronchial ultrasound. He was unable to biopsy the lesion from either a right upper lobe or right middle lobe approach. With endobronchial ultrasound to visualize the lesion but did not have a window due to the adjacent pulmonary artery. He is now referred for consideration for surgical resection.  Since being diagnosed with the lung mass William [name] has noted a variety of pains. He sometimes experiences discomfort in the area of the xiphoid process and more recently has been having a shooting pain in the right lower chest. He says sometimes he feels like he can't breathe. He has not noted wheezing. He also denies a cough. His difficulty breathing usually occurs at rest when he is thinking about it. He does not get short of breath with exertion. His work involves heavy lifting.  He was diagnosed with sleep apnea and was given a CPAP machine, but has not had to use it since his nasal surgery.  [name] Score: At the time of surgery this patient's most appropriate activity status/level should be described as: [x]     0    Normal activity, no symptoms []     1    Restricted in physical strenuous activity but ambulatory, able to do out light  work []     2    Ambulatory and capable of self care, unable to do work activities, up and about >50 % of waking hours                              []     3    Only limited self care, in bed greater than 50% of waking hours []     4    Completely disabled, no self care, confined to bed or chair []     5    Moribund    Past Medical History:  Diagnosis Date  . Anxiety    situational  . Deviated nasal septum   . Hypertension     Past Surgical History:  Procedure Laterality Date  . BACK SURGERY  2002ish   moicrodisectomy  . ENDOBRONCHIAL ULTRASOUND N/A 11/21/2015   Procedure: ENDOBRONCHIAL ULTRASOUND;  Surgeon: Collene Gobble, MD;  Location: [place];  Service: Thoracic;  Laterality: N/A;  . VIDEO BRONCHOSCOPY WITH ENDOBRONCHIAL NAVIGATION N/A 11/21/2015   Procedure: VIDEO BRONCHOSCOPY WITH ENDOBRONCHIAL NAVIGATION;  Surgeon: Collene Gobble, MD;  Location: [facility];  Service: Thoracic;  Laterality: N/A;    Family History  Problem Relation Age of Onset  . Heart attack Father   . Heart attack Paternal Grandfather     Social History Social History  Substance Use Topics  . Smoking status:  Never Smoker  . Smokeless tobacco: Never Used  . Alcohol use No    Current Outpatient Prescriptions  Medication Sig Dispense Refill  . fluticasone (FLONASE) 50 MCG/ACT nasal spray Place 1-2 sprays into both nostrils daily.  0  . omeprazole (PRILOSEC) 40 MG capsule Take 40 mg by mouth daily as needed (heartburn/ eating spicy foods).   3  . predniSONE (STERAPRED UNI-PAK 21 TAB) 5 MG (21) TBPK tablet Take 5 mg by mouth daily.    Marland Kitchen ALPRAZolam (XANAX) 0.5 MG tablet Take 1 tablet (0.5 mg total) by mouth 2 (two) times daily as needed for anxiety. (Patient not taking: Reported on 01/03/2016) 60 tablet 0   No current facility-administered medications for this visit.     No Known Allergies  Review of Systems  Constitutional: Positive for fatigue. Negative for activity change and unexpected weight change.   HENT: Negative for trouble swallowing and voice change.   Eyes: Negative for visual disturbance.  Respiratory: Positive for shortness of breath. Negative for cough and wheezing.   Cardiovascular: Positive for chest pain (Xiphoid region, not exertional). Negative for palpitations and leg swelling.  Gastrointestinal: Negative for abdominal pain and blood in stool.  Genitourinary: Negative for difficulty urinating and flank pain.  Musculoskeletal: Negative for arthralgias and myalgias.  Neurological: Negative for dizziness and syncope.  All other systems reviewed and are negative.   BP (!) 135/95 (BP Location: Left Arm, Patient Position: Sitting, Cuff Size: Large)   Pulse 83   Resp 16   Ht 5\' 10"  (1.778 m)   Wt 240 lb (108.9 kg)   SpO2 96% Comment: ON RA  BMI 34.44 kg/m  Physical Exam  Constitutional: He is oriented to person, place, and time. He appears well-developed and well-nourished. No distress.  HENT:  Head: Normocephalic and atraumatic.  Eyes: EOM are normal. Pupils are equal, round, and reactive to light. No scleral icterus.  Neck: Neck supple. No thyromegaly present.  Cardiovascular: Normal rate, regular rhythm and normal heart sounds.  Exam reveals no gallop and no friction rub.   No murmur heard. Pulmonary/Chest: Effort normal and breath sounds normal. No respiratory distress. He has no wheezes.  Abdominal: Soft. Bowel sounds are normal. He exhibits no distension. There is no tenderness.  Musculoskeletal: He exhibits no edema.  Lymphadenopathy:    He has no cervical adenopathy.  Neurological: He is alert and oriented to person, place, and time. No cranial nerve deficit.  Motor intact  Skin: Skin is warm and dry.  Vitals reviewed.    Diagnostic Tests: CT CHEST WITHOUT CONTRAST  TECHNIQUE: Multidetector CT imaging of the chest was performed using thin slice collimation for electromagnetic bronchoscopy planning purposes, without intravenous  contrast.  COMPARISON:  10/31/2015 PET-CT.  10/13/2015 chest CT.  FINDINGS: Cardiovascular: Normal heart size. No significant pericardial fluid/thickening. Great vessels are normal in course and caliber.  Mediastinum/Nodes: No discrete thyroid nodules. Unremarkable esophagus. No pathologically enlarged axillary, mediastinal or gross hilar lymph nodes, noting limited sensitivity for the detection of hilar adenopathy on this noncontrast study.  Lungs/Pleura: No pneumothorax. No pleural effusion. Central right mid lung 2.7 x 2.1 cm pulmonary nodule, intimately associated with the confluence of the minor and major fissures and abutting the right hilum, uncertain lobe of origin, previously 2.7 x 2.1 cm on 10/14/2015 using similar measurement technique, stable. Calcified 3 mm right upper lobe granuloma. No acute consolidative airspace disease, lung masses or additional significant pulmonary nodules.  Upper abdomen: Mild diffuse hepatic steatosis.  Musculoskeletal: No aggressive  appearing focal osseous lesions. Mild thoracic spondylosis.  IMPRESSION: 1. Stable central right mid lung 2.7 cm pulmonary nodule, intimately associated with the confluence of the minor and major fissures and abutting the right hilum, uncertain lobe of origin. Primary bronchogenic carcinoma is the diagnosis of exclusion given hypermetabolism on the recent PET-CT study. 2. No evidence of metastatic disease in the chest. 3. Mild diffuse hepatic steatosis.   Electronically Signed   By: Ilona Sorrel M.D.   On: 11/16/2015 12:26 NUCLEAR MEDICINE PET SKULL BASE TO THIGH  TECHNIQUE: 12.4 mCi F-18 FDG was injected intravenously. Full-ring PET imaging was performed from the skull base to thigh after the radiotracer. CT data was obtained and used for attenuation correction and anatomic localization.  FASTING BLOOD GLUCOSE:  Value: 104 mg/dl  COMPARISON:  CT on 10/13/2015  FINDINGS: NECK  10  mm right level 2 upper jugular lymph node is seen on image 30/4 which shows FDG uptake with SUV max of 3.9. No other hypermetabolic cervical lymph nodes are identified  CHEST  2.5 cm soft tissue nodule abutting the right hilum is hypermetabolic, with SUV max of 5.6. No other hypermetabolic lymph nodes or pulmonary nodules identified. No evidence of pleural effusion.  ABDOMEN/PELVIS  No abnormal hypermetabolic activity within the liver, pancreas, adrenal glands, or spleen. No hypermetabolic lymph nodes in the abdomen or pelvis. Mild hepatic steatosis noted.  SKELETON  No focal hypermetabolic activity to suggest skeletal metastasis.  IMPRESSION: 2.5 cm hypermetabolic nodule in central right lung abutting the right hilum, suspicious for bronchogenic carcinoma. Bronchoscopy should be considered for tissue diagnosis.  10 mm hypermetabolic right cervical level 2 upper jugular lymph node. Differential diagnosis includes reactive lymphadenopathy and metastatic disease.  No evidence of metastatic disease within the abdomen or pelvis.   Electronically Signed   By: Earle Gell M.D.   On: 10/31/2015 11:23  I reviewed the CT and PET/CT and concur with the findings noted above.  Impression: William [name] is a 45 year old man with a 2.5 cm central right lung mass that is hypermetabolic on PET CT with an SUV of 5.6. Differential diagnosis includes lung cancer, carcinoid tumors or other more rare tumors. Regardless of what the underlying pathology is, a lesion of this size that is hypermetabolic on PET CT needs to be removed. The difficulty in his case arises from the fact that this lesion is very central and at the confluence of the major minor fissures. It is not clear what the lobe of origin is and a bilobectomy or even pneumonectomy may be necessary to remove the lesion. That will not be able to be determined until the time of surgery.  Although he has not had pulmonary function  testing done yet I suspect that his lung function would be able to tolerate even a pneumonectomy if needed area he is young and otherwise healthy and a nonsmoker. He does need pulmonary function testing done prior to surgical resection.  I had a long discussion with Mr. and Mrs. Rice. We reviewed the CT and PET CT images. I do not think the right cervical lymph node is related to the process in his lungs. I recommended right VATS and lung resection either lobectomy, bilobectomy, or possibly pneumonectomy. We discussed the physiologic impact the each of those different types of resection might have. I informed him of the general nature of the procedure, the incisions be used, the need for general anesthesia, the use of a drainage tube postoperatively, the expected hospital stay, and overall  recovery. I informed them of the indications, risks, benefits, and alternatives. They understand the risk include, but are not limited to death, MI, DVT, PE, bleeding, possible need for transfusion, infection, prolonged air leak, cardiac arrhythmias, as well as the possibility of other unforeseeable complications. He understands these issues and did not have any questions about them.  He is reluctant to proceed with surgery. I encouraged him to take his time think it over and talk with his wife. He asked about the possibility of following the lesion radiographically. I advised against that, but certainly if he does not have surgery that will need to be done. I encouraged him to seek another opinion if so desired to make sure that he is making the right decision.  Plan: He did not want to schedule surgery or set up another appointment at this time. He will call us after he's had a chance to think over our discussion.  Melrose Nakayama, MD [facility] [phone]

## 2016-01-08 ENCOUNTER — Other Ambulatory Visit: Payer: Self-pay | Admitting: *Deleted

## 2016-01-08 DIAGNOSIS — R911 Solitary pulmonary nodule: Secondary | ICD-10-CM

## 2016-01-15 ENCOUNTER — Encounter (HOSPITAL_COMMUNITY): Payer: No Typology Code available for payment source

## 2016-01-29 ENCOUNTER — Encounter: Payer: Self-pay | Admitting: Thoracic Surgery (Cardiothoracic Vascular Surgery)

## 2016-01-29 ENCOUNTER — Other Ambulatory Visit: Payer: Self-pay | Admitting: *Deleted

## 2016-01-29 ENCOUNTER — Ambulatory Visit (INDEPENDENT_AMBULATORY_CARE_PROVIDER_SITE_OTHER): Payer: No Typology Code available for payment source | Admitting: Thoracic Surgery (Cardiothoracic Vascular Surgery)

## 2016-01-29 VITALS — BP 141/95 | HR 72 | Resp 16 | Ht 70.5 in | Wt 249.0 lb

## 2016-01-29 DIAGNOSIS — R911 Solitary pulmonary nodule: Secondary | ICD-10-CM | POA: Diagnosis not present

## 2016-01-29 DIAGNOSIS — R918 Other nonspecific abnormal finding of lung field: Secondary | ICD-10-CM

## 2016-01-29 NOTE — Progress Notes (Signed)
[address]       [name] [address]             [phone]       HPI: William [place] [name] William [name] returns for another visit to further discuss management of his right lung mass.   [place] [name] William [name] is a 46 year old [name] who was found to have a right hilar mass on chest x-ray after presenting to [name] emergency room with difficulty breathing. This occurred after he was hit in [name] nose and face with a baseball. A CT of [name] chest showed a 2.5 cm right hilar mass complements of [name] major and minor fissures. Dr. Lamonte Sakai did a PET/CT which showed [name] lesion was hypermetabolic. He did navigational bronchoscopy and endobronchial ultrasound which was nondiagnostic.  I saw him in consultation on 01/03/2016 and we discussed options. We discussed [name] repeated attempts to biopsy versus surgical resection. I recommended we proceed with surgical resection. He was reluctant to have surgery and needed some time to think about his options.  He now returns. He says he feels he feels well. He has an occasional sharp substernal pain that he thinks may be indigestion. He is not having any shortness of breath or wheezing, cough or hemoptysis.  Zubrod Score: At [name] time of surgery this patient's most appropriate activity status/level should be described as: [x]     0    Normal activity, no symptoms []     1    Restricted in physical strenuous activity but ambulatory, able to do out light work []     2    Ambulatory and capable of self care, unable to do work activities, up and about >50 % of waking hours                              []     3    Only limited self care, in bed greater than 50% of waking hours []     4    Completely disabled, no self care, confined to bed or chair []     5    Moribund   Past Medical History:  Diagnosis Date  . Anxiety    situational  . Deviated nasal septum   . Hypertension    Past Surgical History:  Procedure Laterality Date  . BACK SURGERY  2002ish   moicrodisectomy  .  ENDOBRONCHIAL ULTRASOUND N/A 11/21/2015   Procedure: ENDOBRONCHIAL ULTRASOUND;  Surgeon: Collene Gobble, MD;  Location: [facility];  Service: Thoracic;  Laterality: N/A;  . VIDEO BRONCHOSCOPY WITH ENDOBRONCHIAL NAVIGATION N/A 11/21/2015   Procedure: VIDEO BRONCHOSCOPY WITH ENDOBRONCHIAL NAVIGATION;  Surgeon: Collene Gobble, MD;  Location: [facility];  Service: Thoracic;  Laterality: N/A;    Current Outpatient Prescriptions  Medication Sig Dispense Refill  . ALPRAZolam (XANAX) 0.5 MG tablet Take 1 tablet (0.5 mg total) by mouth 2 (two) times daily as needed for anxiety. 60 tablet 0  . fluticasone (FLONASE) 50 MCG/ACT nasal spray Place 1-2 sprays into both nostrils daily.  0  . omeprazole (PRILOSEC) 40 MG capsule Take 40 mg by mouth daily as needed (heartburn/ eating spicy foods).   3  . predniSONE (STERAPRED UNI-PAK 21 TAB) 5 MG (21) TBPK tablet Take 5 mg by mouth daily.     No current facility-administered medications for this visit.     Physical Exam BP (!) 141/95 (BP Location: Right Arm, Patient Position: Sitting, Cuff Size: Large)  Pulse 72   Resp 16   Ht 5' 10.5" (1.791 m)   Wt 249 lb (112.9 kg)   SpO2 99% Comment: on RA  BMI 35.85 kg/m  46 year old [name] in no acute distress Alert and oriented 3 with no focal deficits Well-developed and well-nourished Anxious No cervical or supraclavicular adenopathy Cardiac regular rate and rhythm normal S1 and S2 Lungs clear with equal breath sound bilaterally Abdomen soft nontender Extremities without clubbing cyanosis or edema  Diagnostic Tests: CT CHEST WITHOUT CONTRAST  TECHNIQUE: Multidetector CT imaging of [name] chest was performed using thin slice collimation for electromagnetic bronchoscopy planning purposes, without intravenous contrast.  COMPARISON:  10/31/2015 PET-CT.  10/13/2015 chest CT.  FINDINGS: Cardiovascular: Normal heart size. No significant pericardial fluid/thickening. Great vessels are normal in course and  caliber.  Mediastinum/Nodes: No discrete thyroid nodules. Unremarkable esophagus. No pathologically enlarged axillary, mediastinal or gross hilar lymph nodes, noting limited sensitivity for [name] detection of hilar adenopathy on this noncontrast study.  Lungs/Pleura: No pneumothorax. No pleural effusion. Central right mid lung 2.7 x 2.1 cm pulmonary nodule, intimately associated with [name] confluence of [name] minor and major fissures and abutting [name] right hilum, uncertain lobe of origin, previously 2.7 x 2.1 cm on 10/14/2015 using similar measurement technique, stable. Calcified 3 mm right upper lobe granuloma. No acute consolidative airspace disease, lung masses or additional significant pulmonary nodules.  Upper abdomen: Mild diffuse hepatic steatosis.  Musculoskeletal: No aggressive appearing focal osseous lesions. Mild thoracic spondylosis.  IMPRESSION: 1. Stable central right mid lung 2.7 cm pulmonary nodule, intimately associated with [name] confluence of [name] minor and major fissures and abutting [name] right hilum, uncertain lobe of origin. Primary bronchogenic carcinoma is [name] diagnosis of exclusion given hypermetabolism on [name] recent PET-CT study. 2. No evidence of metastatic disease in [name] chest. 3. Mild diffuse hepatic steatosis.   Electronically Signed   By: Ilona Sorrel M.D.   On: 11/16/2015 12:26   NUCLEAR MEDICINE PET SKULL BASE TO THIGH  TECHNIQUE: 12.4 mCi F-18 FDG was injected intravenously. Full-ring PET imaging was performed from [name] skull base to thigh after [name] radiotracer. CT data was obtained and used for attenuation correction and anatomic localization.  FASTING BLOOD GLUCOSE:  Value: 104 mg/dl  COMPARISON:  CT on 10/13/2015  FINDINGS: NECK  10 mm right level 2 upper jugular lymph node is seen on image 30/4 which shows FDG uptake with SUV max of 3.9. No other hypermetabolic cervical lymph nodes are identified  CHEST  2.5 cm soft  tissue nodule abutting [name] right hilum is hypermetabolic, with SUV max of 5.6. No other hypermetabolic lymph nodes or pulmonary nodules identified. No evidence of pleural effusion.  ABDOMEN/PELVIS  No abnormal hypermetabolic activity within [name] liver, pancreas, adrenal glands, or spleen. No hypermetabolic lymph nodes in [name] abdomen or pelvis. Mild hepatic steatosis noted.  SKELETON  No focal hypermetabolic activity to suggest skeletal metastasis.  IMPRESSION: 2.5 cm hypermetabolic nodule in central right lung abutting [name] right hilum, suspicious for bronchogenic carcinoma. Bronchoscopy should be considered for tissue diagnosis.  10 mm hypermetabolic right cervical level 2 upper jugular lymph node. Differential diagnosis includes reactive lymphadenopathy and metastatic disease.  No evidence of metastatic disease within [name] abdomen or pelvis.   Electronically Signed   By: Earle Gell M.D.   On: 10/31/2015 11:23   I personally reviewed his CT and PET CT again and concur with [name] findings noted above.  Impression: William William [name] William [name] is a 46 year old [name] with a hypermetabolic mass in  [name] right hilum confluence of [name] major and minor fissures. It is unclear what [name] lobe of origin is based on [name] scans. This lesion is hypermetabolic on PET/CT. Radiology favors this being a primary bronchogenic carcinoma, although I think carcinoid is as likely a possibility. In either case I think [name] mass needs to come out. Even if its a well-differentiated carcinoid, it is an a crucial location and if it continues to get bigger could cause serious problems. It will only get more difficult to take out later on. Obviously if it is cancer it needs to come out as soon as possible.  I described [name] proposed operation again to [name] William [name]. He understands this would be a right video-assisted thoracoscopy with possible thoracotomy for pulmonary resection which could include lobectomy, bilobectomy, or  pneumonectomy. I assured him that I will do my best to preserve as much lung function as possible and a sleeve resection may be needed to do so depending on [name] underlying pathology. I informed him of [name] indications, risks, benefits, and alternatives previously. He understands [name] risks include, but are not limited to death, MI, DVT, PE, bleeding,, possible need for transfusion, infection, air leaks, arrhythmias, as well as other unforeseeable complications. His primary concerns are his quality of life and activity levels postoperatively. I don't think it even a pneumonectomy would leave him with an unacceptable quality-of-life although it would likely limit him in terms of his physical activities. Impossible to predict beforehand what degree that will effect any individual. If we can do it with less than a pneumonectomy suspect that he will regain nearly full function.  After going through his questions she has decided to proceed. We will plan for surgery on Wednesday, 02/13/2016. We will need to repeat his chest x-ray to make shows not been any significant change. He does need pulmonary function testing although I don't think that will be an issue is his case as he is a nonsmoker.  Plan: Right VATS pulmonary resection on Wednesday, 02/13/2016  Melrose Nakayama, MD [facility] [phone]

## 2016-02-11 ENCOUNTER — Ambulatory Visit (HOSPITAL_COMMUNITY)
Admission: RE | Admit: 2016-02-11 | Discharge: 2016-02-11 | Disposition: A | Discharge status: Home / Self Care | Payer: No Typology Code available for payment source | Source: Ambulatory Visit | Attending: Thoracic Surgery (Cardiothoracic Vascular Surgery) | Admitting: Thoracic Surgery (Cardiothoracic Vascular Surgery)

## 2016-02-11 ENCOUNTER — Encounter (HOSPITAL_COMMUNITY)
Admission: RE | Admit: 2016-02-11 | Discharge: 2016-02-11 | Disposition: A | Discharge status: Home / Self Care | Payer: No Typology Code available for payment source | Source: Ambulatory Visit | Attending: Thoracic Surgery (Cardiothoracic Vascular Surgery) | Admitting: Thoracic Surgery (Cardiothoracic Vascular Surgery)

## 2016-02-11 ENCOUNTER — Encounter (HOSPITAL_COMMUNITY): Payer: Self-pay

## 2016-02-11 DIAGNOSIS — R918 Other nonspecific abnormal finding of lung field: Secondary | ICD-10-CM | POA: Insufficient documentation

## 2016-02-11 DIAGNOSIS — R942 Abnormal results of pulmonary function studies: Secondary | ICD-10-CM

## 2016-02-11 DIAGNOSIS — Z01810 Encounter for preprocedural cardiovascular examination: Secondary | ICD-10-CM | POA: Insufficient documentation

## 2016-02-11 DIAGNOSIS — Z01818 Encounter for other preprocedural examination: Secondary | ICD-10-CM | POA: Insufficient documentation

## 2016-02-11 DIAGNOSIS — R9431 Abnormal electrocardiogram [ECG] [EKG]: Secondary | ICD-10-CM

## 2016-02-11 HISTORY — DX: Gastro-esophageal reflux disease without esophagitis: K21.9

## 2016-02-11 HISTORY — DX: Sleep apnea, unspecified: G47.30

## 2016-02-11 LAB — PULMONARY FUNCTION TEST
DL/VA % PRED: 103 %
DL/VA: 4.83 ml/min/mmHg/L
DLCO UNC: 26.58 ml/min/mmHg
DLCO cor % pred: 81 %
DLCO cor: 26.36 ml/min/mmHg
DLCO unc % pred: 82 %
FEF 25-75 PRE: 3.2 L/s
FEF 25-75 Post: 2.49 L/sec
FEF2575-%CHANGE-POST: -22 %
FEF2575-%PRED-POST: 68 %
FEF2575-%Pred-Pre: 87 %
FEV1-%CHANGE-POST: 2 %
FEV1-%PRED-PRE: 76 %
FEV1-%Pred-Post: 78 %
FEV1-PRE: 3.09 L
FEV1-Post: 3.17 L
FEV1FVC-%Change-Post: -3 %
FEV1FVC-%PRED-PRE: 105 %
FEV6-%Change-Post: 6 %
FEV6-%PRED-POST: 79 %
FEV6-%Pred-Pre: 74 %
FEV6-POST: 3.96 L
FEV6-PRE: 3.71 L
FEV6FVC-%PRED-POST: 103 %
FEV6FVC-%PRED-PRE: 103 %
FVC-%CHANGE-POST: 6 %
FVC-%PRED-PRE: 72 %
FVC-%Pred-Post: 76 %
FVC-POST: 3.96 L
FVC-PRE: 3.71 L
Post FEV1/FVC ratio: 80 %
Post FEV6/FVC ratio: 100 %
Pre FEV1/FVC ratio: 83 %
Pre FEV6/FVC Ratio: 100 %
RV % PRED: 82 %
RV: 1.62 L
TLC % pred: 82 %
TLC: 5.76 L

## 2016-02-11 LAB — BLOOD GAS, ARTERIAL
Acid-Base Excess: 2.2 mmol/L — ABNORMAL HIGH (ref 0.0–2.0)
Bicarbonate: 26.2 mmol/L (ref 20.0–28.0)
DRAWN BY: 27052
FIO2: 0.21
O2 Saturation: 96.4 %
PH ART: 7.431 (ref 7.350–7.450)
Patient temperature: 98.6
pCO2 arterial: 40 mmHg (ref 32.0–48.0)
pO2, Arterial: 75.6 mmHg — ABNORMAL LOW (ref 83.0–108.0)

## 2016-02-11 LAB — URINALYSIS, ROUTINE W REFLEX MICROSCOPIC
BILIRUBIN URINE: NEGATIVE
Glucose, UA: NEGATIVE mg/dL
Hgb urine dipstick: NEGATIVE
Ketones, ur: NEGATIVE mg/dL
Leukocytes, UA: NEGATIVE
NITRITE: NEGATIVE
Protein, ur: NEGATIVE mg/dL
SPECIFIC GRAVITY, URINE: 1.017 (ref 1.005–1.030)
pH: 6 (ref 5.0–8.0)

## 2016-02-11 LAB — COMPREHENSIVE METABOLIC PANEL
ALBUMIN: 4.3 g/dL (ref 3.5–5.0)
ALK PHOS: 50 U/L (ref 38–126)
ALT: 38 U/L (ref 17–63)
AST: 29 U/L (ref 15–41)
Anion gap: 11 (ref 5–15)
BILIRUBIN TOTAL: 0.9 mg/dL (ref 0.3–1.2)
BUN: 11 mg/dL (ref 6–20)
CO2: 29 mmol/L (ref 22–32)
CREATININE: 1.07 mg/dL (ref 0.61–1.24)
Calcium: 9.6 mg/dL (ref 8.9–10.3)
Chloride: 100 mmol/L — ABNORMAL LOW (ref 101–111)
GFR calc Af Amer: >60 mL/min (ref >60)
GFR calc non Af Amer: >60 mL/min (ref >60)
GLUCOSE: 92 mg/dL (ref 65–99)
Potassium: 3.8 mmol/L (ref 3.5–5.1)
Sodium: 140 mmol/L (ref 135–145)
TOTAL PROTEIN: 7 g/dL (ref 6.5–8.1)

## 2016-02-11 LAB — CBC
HEMATOCRIT: 44 % (ref 39.0–52.0)
HEMOGLOBIN: 14.6 g/dL (ref 13.0–17.0)
MCH: 29 pg (ref 26.0–34.0)
MCHC: 33.2 g/dL (ref 30.0–36.0)
MCV: 87.5 fL (ref 78.0–100.0)
Platelets: 187 10*3/uL (ref 150–400)
RBC: 5.03 MIL/uL (ref 4.22–5.81)
RDW: 12.9 % (ref 11.5–15.5)
WBC: 7.2 10*3/uL (ref 4.0–10.5)

## 2016-02-11 LAB — TYPE AND SCREEN
ABO/RH(D): A POS
ANTIBODY SCREEN: NEGATIVE

## 2016-02-11 LAB — PROTIME-INR
INR: 0.97
Prothrombin Time: 12.9 seconds (ref 11.4–15.2)

## 2016-02-11 LAB — SURGICAL PCR SCREEN
MRSA, PCR: NEGATIVE
STAPHYLOCOCCUS AUREUS: POSITIVE — AB

## 2016-02-11 LAB — ABO/RH: ABO/RH(D): A POS

## 2016-02-11 LAB — APTT: APTT: 28 s (ref 24–36)

## 2016-02-11 MED ORDER — ALBUTEROL SULFATE (2.5 MG/3ML) 0.083% IN NEBU
2.5000 mg | INHALATION_SOLUTION | Freq: Once | RESPIRATORY_TRACT | Status: AC
  Administered 2016-02-11: 2.5 mg via RESPIRATORY_TRACT

## 2016-02-11 NOTE — Progress Notes (Signed)
Mupirocin Ointment Rx called into [facility] in [place] for positive PCR of Staph. Pt notified and voiced understanding

## 2016-02-11 NOTE — Progress Notes (Deleted)
Denies any cardiac issues.   PCP is Dr. Lesly Rubenstein  LOV 01/2016 Behavioral Health MD is Dr. Doyne Keel  LOV 01/2016 When I asked her if, if she had to have blood, would you refuse it, at first she said, yes.  When asked if she was of a certain religion, she said, "No".  "I just don't want to get someone else's blood that is not clean".Marland KitchenMarland KitchenMarland KitchenShe did say, that if it was a matter of life and death, she would accept it.

## 2016-02-11 NOTE — Progress Notes (Signed)
PCP is Dr. Ann Held  lov 01/2016   Did a 'home testing' for OSA, used the machine & mask x 1 month, had nasal surgery with Dr. Blenda Peals [facility], and now doesn't have to use the mask.

## 2016-02-11 NOTE — Pre-Procedure Instructions (Signed)
   William [name]  02/11/2016      [facility] [place], [place] [address] Phone: [phone] Fax: [phone]    Your procedure is scheduled on Wednesday, February 7th   Report to [facility] Admitting at 6:30 am.             (posted surgery time 8:30 - 10:56 am)   Call this number if you have problems the [facility] of surgery:  [phone].  [facility] is closed on weekends.   Remember:  Do not eat food or drink liquids after midnight Tuesday.   Take these medicines the morning of surgery with A SIP OF WATER : Omeprazole             4-5 days prior to surgery, STOP taking any Vitamins, Herbal Supplements, Anti-inflammatories   Do not wear jewelry - no rings or watches.  Do not wear lotions, colognes or deoderant.             Men may shave face and neck.   Do not bring valuables to the hospital.  [facility] is not responsible for any belongings or valuables.  Contacts, dentures or bridgework may not be worn into surgery.  Leave your suitcase in the car.  After surgery it may be brought to your room.  For patients admitted to the hospital, discharge time will be determined by your treatment team.  Please read over the following fact sheets that you were given. Pain Booklet, MRSA Information and Surgical Site Infection Prevention

## 2016-02-13 ENCOUNTER — Encounter (HOSPITAL_COMMUNITY): Payer: Self-pay | Admitting: Urology

## 2016-02-13 ENCOUNTER — Encounter (HOSPITAL_COMMUNITY)
Admission: RE | Disposition: A | Discharge status: Home / Self Care | Payer: Self-pay | Source: Ambulatory Visit | Attending: Thoracic Surgery (Cardiothoracic Vascular Surgery)

## 2016-02-13 ENCOUNTER — Inpatient Hospital Stay (HOSPITAL_COMMUNITY)
Admission: RE | Admit: 2016-02-13 | Discharge: 2016-02-15 | DRG: 168 | Disposition: A | Discharge status: Home / Self Care | Payer: No Typology Code available for payment source | Source: Ambulatory Visit | Attending: Thoracic Surgery (Cardiothoracic Vascular Surgery) | Admitting: Thoracic Surgery (Cardiothoracic Vascular Surgery)

## 2016-02-13 ENCOUNTER — Inpatient Hospital Stay (HOSPITAL_COMMUNITY): Payer: No Typology Code available for payment source

## 2016-02-13 ENCOUNTER — Inpatient Hospital Stay (HOSPITAL_COMMUNITY): Payer: No Typology Code available for payment source | Admitting: Certified Registered"

## 2016-02-13 ENCOUNTER — Inpatient Hospital Stay (HOSPITAL_COMMUNITY): Payer: No Typology Code available for payment source | Admitting: Vascular Surgery

## 2016-02-13 DIAGNOSIS — Z09 Encounter for follow-up examination after completed treatment for conditions other than malignant neoplasm: Secondary | ICD-10-CM

## 2016-02-13 DIAGNOSIS — F419 Anxiety disorder, unspecified: Secondary | ICD-10-CM | POA: Diagnosis present

## 2016-02-13 DIAGNOSIS — G4730 Sleep apnea, unspecified: Secondary | ICD-10-CM | POA: Diagnosis present

## 2016-02-13 DIAGNOSIS — R590 Localized enlarged lymph nodes: Secondary | ICD-10-CM | POA: Diagnosis present

## 2016-02-13 DIAGNOSIS — R918 Other nonspecific abnormal finding of lung field: Secondary | ICD-10-CM | POA: Diagnosis present

## 2016-02-13 DIAGNOSIS — R222 Localized swelling, mass and lump, trunk: Secondary | ICD-10-CM | POA: Diagnosis not present

## 2016-02-13 DIAGNOSIS — I10 Essential (primary) hypertension: Secondary | ICD-10-CM | POA: Diagnosis present

## 2016-02-13 DIAGNOSIS — K219 Gastro-esophageal reflux disease without esophagitis: Secondary | ICD-10-CM | POA: Diagnosis present

## 2016-02-13 HISTORY — PX: VIDEO ASSISTED THORACOSCOPY (VATS)/WEDGE RESECTION: SHX6174

## 2016-02-13 SURGERY — VIDEO ASSISTED THORACOSCOPY (VATS)/WEDGE RESECTION
Anesthesia: General | Site: Chest | Laterality: Right

## 2016-02-13 MED ORDER — ONDANSETRON HCL 4 MG/2ML IJ SOLN: 4.0000 mg | Freq: Four times a day (QID) | INTRAMUSCULAR | Status: DC | PRN

## 2016-02-13 MED ORDER — HYDROMORPHONE HCL 1 MG/ML IJ SOLN
0.2500 mg | INTRAMUSCULAR | Status: DC | PRN
  Administered 2016-02-13 (×6): 0.25 mg via INTRAVENOUS

## 2016-02-13 MED ORDER — SENNOSIDES-DOCUSATE SODIUM 8.6-50 MG PO TABS
1.0000 | ORAL_TABLET | Freq: Every day | ORAL | Status: DC
  Administered 2016-02-13 – 2016-02-14 (×2): 1 via ORAL
  Filled 2016-02-13 (×2): qty 1

## 2016-02-13 MED ORDER — SINUS RINSE NA PACK: PACK | Freq: Two times a day (BID) | NASAL | Status: DC

## 2016-02-13 MED ORDER — DEXTROSE 5 % IV SOLN
1.5000 g | Freq: Two times a day (BID) | INTRAVENOUS | Status: AC
  Administered 2016-02-13 – 2016-02-14 (×2): 1.5 g via INTRAVENOUS
  Filled 2016-02-13 (×2): qty 1.5

## 2016-02-13 MED ORDER — FENTANYL CITRATE (PF) 250 MCG/5ML IJ SOLN
INTRAMUSCULAR | Status: AC
Start: 2016-02-13 — End: 2016-02-13
  Filled 2016-02-13: qty 5

## 2016-02-13 MED ORDER — ROCURONIUM BROMIDE 100 MG/10ML IV SOLN
INTRAVENOUS | Status: DC | PRN
  Administered 2016-02-13 (×3): 20 mg via INTRAVENOUS
  Administered 2016-02-13: 50 mg via INTRAVENOUS
  Administered 2016-02-13: 20 mg via INTRAVENOUS

## 2016-02-13 MED ORDER — ACETAMINOPHEN 160 MG/5ML PO SOLN
1000.0000 mg | Freq: Four times a day (QID) | ORAL | Status: DC
  Administered 2016-02-15: 1000 mg via ORAL
  Filled 2016-02-13: qty 40.6

## 2016-02-13 MED ORDER — PROPOFOL 10 MG/ML IV BOLUS
INTRAVENOUS | Status: AC
  Filled 2016-02-13: qty 20

## 2016-02-13 MED ORDER — OXYCODONE HCL 5 MG PO TABS: 5.0000 mg | ORAL_TABLET | Freq: Once | ORAL | Status: DC | PRN

## 2016-02-13 MED ORDER — NALOXONE HCL 0.4 MG/ML IJ SOLN: 0.4000 mg | INTRAMUSCULAR | Status: DC | PRN

## 2016-02-13 MED ORDER — ENOXAPARIN SODIUM 40 MG/0.4ML ~~LOC~~ SOLN
40.0000 mg | Freq: Every day | SUBCUTANEOUS | Status: DC
  Administered 2016-02-14 – 2016-02-15 (×2): 40 mg via SUBCUTANEOUS
  Filled 2016-02-13 (×2): qty 0.4

## 2016-02-13 MED ORDER — SODIUM CHLORIDE 0.9 % IV SOLN
30.0000 meq | Freq: Every day | INTRAVENOUS | Status: DC | PRN
  Filled 2016-02-13: qty 15

## 2016-02-13 MED ORDER — FENTANYL CITRATE (PF) 250 MCG/5ML IJ SOLN
INTRAMUSCULAR | Status: AC
  Filled 2016-02-13: qty 5

## 2016-02-13 MED ORDER — BISACODYL 5 MG PO TBEC
10.0000 mg | DELAYED_RELEASE_TABLET | Freq: Every day | ORAL | Status: DC
  Administered 2016-02-13 – 2016-02-15 (×3): 10 mg via ORAL
  Filled 2016-02-13 (×3): qty 2

## 2016-02-13 MED ORDER — MIDAZOLAM HCL 2 MG/2ML IJ SOLN
INTRAMUSCULAR | Status: AC
  Filled 2016-02-13: qty 2

## 2016-02-13 MED ORDER — SUGAMMADEX SODIUM 500 MG/5ML IV SOLN
INTRAVENOUS | Status: AC
  Filled 2016-02-13: qty 5

## 2016-02-13 MED ORDER — HYDROMORPHONE HCL 1 MG/ML IJ SOLN
INTRAMUSCULAR | Status: AC
  Administered 2016-02-13: 0.25 mg via INTRAVENOUS
  Filled 2016-02-13: qty 1

## 2016-02-13 MED ORDER — FENTANYL 40 MCG/ML IV SOLN
INTRAVENOUS | Status: DC
  Administered 2016-02-13: 13:00:00 via INTRAVENOUS
  Filled 2016-02-13: qty 25

## 2016-02-13 MED ORDER — ACETAMINOPHEN 500 MG PO TABS
1000.0000 mg | ORAL_TABLET | Freq: Four times a day (QID) | ORAL | Status: DC
  Administered 2016-02-13 – 2016-02-15 (×8): 1000 mg via ORAL
  Filled 2016-02-13 (×8): qty 2

## 2016-02-13 MED ORDER — HEMOSTATIC AGENTS (NO CHARGE) OPTIME
TOPICAL | Status: DC | PRN
  Administered 2016-02-13: 1 via TOPICAL

## 2016-02-13 MED ORDER — OXYCODONE HCL 5 MG PO TABS: 5.0000 mg | ORAL_TABLET | ORAL | Status: DC | PRN

## 2016-02-13 MED ORDER — 0.9 % SODIUM CHLORIDE (POUR BTL) OPTIME
TOPICAL | Status: DC | PRN
  Administered 2016-02-13: 1000 mL

## 2016-02-13 MED ORDER — KETOROLAC TROMETHAMINE 30 MG/ML IJ SOLN
INTRAMUSCULAR | Status: AC
  Administered 2016-02-13: 30 mg via INTRAVENOUS
  Filled 2016-02-13: qty 1

## 2016-02-13 MED ORDER — DIPHENHYDRAMINE HCL 50 MG/ML IJ SOLN: 12.5000 mg | Freq: Four times a day (QID) | INTRAMUSCULAR | Status: DC | PRN

## 2016-02-13 MED ORDER — LACTATED RINGERS IV SOLN
INTRAVENOUS | Status: DC | PRN
  Administered 2016-02-13 (×2): via INTRAVENOUS

## 2016-02-13 MED ORDER — KETOROLAC TROMETHAMINE 30 MG/ML IJ SOLN
30.0000 mg | Freq: Four times a day (QID) | INTRAMUSCULAR | Status: AC
  Administered 2016-02-13 – 2016-02-15 (×8): 30 mg via INTRAVENOUS
  Filled 2016-02-13 (×7): qty 1

## 2016-02-13 MED ORDER — ONDANSETRON HCL 4 MG/2ML IJ SOLN
INTRAMUSCULAR | Status: DC | PRN
  Administered 2016-02-13: 4 mg via INTRAVENOUS

## 2016-02-13 MED ORDER — PROPOFOL 10 MG/ML IV BOLUS
INTRAVENOUS | Status: DC | PRN
  Administered 2016-02-13: 200 mg via INTRAVENOUS
  Administered 2016-02-13: 30 mg via INTRAVENOUS

## 2016-02-13 MED ORDER — SUCCINYLCHOLINE CHLORIDE 200 MG/10ML IV SOSY
PREFILLED_SYRINGE | INTRAVENOUS | Status: AC
  Filled 2016-02-13: qty 10

## 2016-02-13 MED ORDER — BUPIVACAINE 0.5 % ON-Q PUMP SINGLE CATH 400 ML
INJECTION | Status: DC | PRN
  Administered 2016-02-13: 400 mL

## 2016-02-13 MED ORDER — DIPHENHYDRAMINE HCL 12.5 MG/5ML PO ELIX: 12.5000 mg | ORAL_SOLUTION | Freq: Four times a day (QID) | ORAL | Status: DC | PRN

## 2016-02-13 MED ORDER — BUPIVACAINE 0.5 % ON-Q PUMP SINGLE CATH 400 ML
400.0000 mL | INJECTION | Status: DC
  Filled 2016-02-13: qty 400

## 2016-02-13 MED ORDER — ALPRAZOLAM 0.5 MG PO TABS: 0.5000 mg | ORAL_TABLET | Freq: Two times a day (BID) | ORAL | Status: DC | PRN

## 2016-02-13 MED ORDER — SUGAMMADEX SODIUM 200 MG/2ML IV SOLN
INTRAVENOUS | Status: DC | PRN
  Administered 2016-02-13: 250 mg via INTRAVENOUS

## 2016-02-13 MED ORDER — OXYCODONE HCL 5 MG/5ML PO SOLN: 5.0000 mg | Freq: Once | ORAL | Status: DC | PRN

## 2016-02-13 MED ORDER — FLUTICASONE PROPIONATE 50 MCG/ACT NA SUSP: 1.0000 | Freq: Every day | NASAL | Status: DC | PRN

## 2016-02-13 MED ORDER — PANTOPRAZOLE SODIUM 40 MG PO TBEC
40.0000 mg | DELAYED_RELEASE_TABLET | Freq: Every day | ORAL | Status: DC
  Administered 2016-02-14 – 2016-02-15 (×2): 40 mg via ORAL
  Filled 2016-02-13 (×2): qty 1

## 2016-02-13 MED ORDER — SODIUM CHLORIDE 0.9% FLUSH: 9.0000 mL | INTRAVENOUS | Status: DC | PRN

## 2016-02-13 MED ORDER — BUPIVACAINE HCL (PF) 0.5 % IJ SOLN
INTRAMUSCULAR | Status: AC
  Filled 2016-02-13: qty 10

## 2016-02-13 MED ORDER — DEXTROSE 5 % IV SOLN
1.5000 g | INTRAVENOUS | Status: AC
  Administered 2016-02-13: 1.5 g via INTRAVENOUS

## 2016-02-13 MED ORDER — HYDROMORPHONE HCL 1 MG/ML IJ SOLN
INTRAMUSCULAR | Status: AC
  Administered 2016-02-13: 0.25 mg via INTRAVENOUS
  Filled 2016-02-13: qty 0.5

## 2016-02-13 MED ORDER — MIDAZOLAM HCL 5 MG/5ML IJ SOLN
INTRAMUSCULAR | Status: DC | PRN
  Administered 2016-02-13: 2 mg via INTRAVENOUS

## 2016-02-13 MED ORDER — FENTANYL CITRATE (PF) 100 MCG/2ML IJ SOLN
INTRAMUSCULAR | Status: DC | PRN
  Administered 2016-02-13 (×7): 50 ug via INTRAVENOUS
  Administered 2016-02-13: 150 ug via INTRAVENOUS

## 2016-02-13 MED ORDER — TRAMADOL HCL 50 MG PO TABS
50.0000 mg | ORAL_TABLET | Freq: Four times a day (QID) | ORAL | Status: DC | PRN
  Administered 2016-02-15: 50 mg via ORAL
  Filled 2016-02-13: qty 1

## 2016-02-13 MED ORDER — BUPIVACAINE HCL (PF) 0.5 % IJ SOLN
INTRAMUSCULAR | Status: DC | PRN
  Administered 2016-02-13: 10 mL

## 2016-02-13 MED ORDER — ONDANSETRON HCL 4 MG/2ML IJ SOLN
4.0000 mg | Freq: Four times a day (QID) | INTRAMUSCULAR | Status: DC | PRN
  Administered 2016-02-13: 4 mg via INTRAVENOUS
  Filled 2016-02-13: qty 2

## 2016-02-13 MED ORDER — CEFUROXIME SODIUM 1.5 G IJ SOLR
INTRAMUSCULAR | Status: AC
  Filled 2016-02-13: qty 1.5

## 2016-02-13 MED ORDER — SUCCINYLCHOLINE CHLORIDE 20 MG/ML IJ SOLN
INTRAMUSCULAR | Status: DC | PRN
  Administered 2016-02-13: 140 mg via INTRAVENOUS

## 2016-02-13 MED ORDER — DEXTROSE-NACL 5-0.9 % IV SOLN
INTRAVENOUS | Status: DC
  Administered 2016-02-13 – 2016-02-14 (×3): via INTRAVENOUS

## 2016-02-13 MED ORDER — ROCURONIUM BROMIDE 50 MG/5ML IV SOSY
PREFILLED_SYRINGE | INTRAVENOUS | Status: AC
  Filled 2016-02-13: qty 15

## 2016-02-13 SURGICAL SUPPLY — 106 items
ADH SKN CLS APL DERMABOND .7 (GAUZE/BANDAGES/DRESSINGS)
APL SKNCLS STERI-STRIP NONHPOA (GAUZE/BANDAGES/DRESSINGS) ×1
APL SRG 22X2 LUM MLBL SLNT (VASCULAR PRODUCTS) ×1
APPLICATOR TIP EXT COSEAL (VASCULAR PRODUCTS) ×2 IMPLANT
BAG SPEC RTRVL LRG 6X4 10 (ENDOMECHANICALS)
BENZOIN TINCTURE PRP APPL 2/3 (GAUZE/BANDAGES/DRESSINGS) ×3 IMPLANT
CANISTER SUCTION 2500CC (MISCELLANEOUS) ×6 IMPLANT
CATH KIT ON Q 5IN SLV (PAIN MANAGEMENT) IMPLANT
CATH KIT ON-Q SILVERSOAK 5 (CATHETERS) IMPLANT
CATH KIT ON-Q SILVERSOAK 5IN (CATHETERS) ×3 IMPLANT
CATH THORACIC 28FR (CATHETERS) ×2 IMPLANT
CATH THORACIC 36FR (CATHETERS) IMPLANT
CATH THORACIC 36FR RT ANG (CATHETERS) IMPLANT
CLIP TI MEDIUM 24 (CLIP) ×2 IMPLANT
CLIP TI MEDIUM 6 (CLIP) ×3 IMPLANT
CONN ST 1/4X3/8  BEN (MISCELLANEOUS)
CONN ST 1/4X3/8 BEN (MISCELLANEOUS) IMPLANT
CONN Y 3/8X3/8X3/8  BEN (MISCELLANEOUS)
CONN Y 3/8X3/8X3/8 BEN (MISCELLANEOUS) IMPLANT
CONT SPEC 4OZ CLIKSEAL STRL BL (MISCELLANEOUS) ×12 IMPLANT
COVER SURGICAL LIGHT HANDLE (MISCELLANEOUS) ×3 IMPLANT
CUTTER ECHEON FLEX ENDO 45 340 (ENDOMECHANICALS) ×2 IMPLANT
DERMABOND ADVANCED (GAUZE/BANDAGES/DRESSINGS)
DERMABOND ADVANCED .7 DNX12 (GAUZE/BANDAGES/DRESSINGS) IMPLANT
DRAIN CHANNEL 28F RND 3/8 FF (WOUND CARE) IMPLANT
DRAIN CHANNEL 32F RND 10.7 FF (WOUND CARE) IMPLANT
DRAPE LAPAROSCOPIC ABDOMINAL (DRAPES) ×3 IMPLANT
DRAPE WARM FLUID 44X44 (DRAPE) ×3 IMPLANT
ELECT BLADE 6.5 EXT (BLADE) ×3 IMPLANT
ELECT REM PT RETURN 9FT ADLT (ELECTROSURGICAL) ×3
ELECTRODE REM PT RTRN 9FT ADLT (ELECTROSURGICAL) ×1 IMPLANT
GAUZE SPONGE 4X4 12PLY STRL (GAUZE/BANDAGES/DRESSINGS) ×3 IMPLANT
GLOVE BIO SURGEON STRL SZ 6 (GLOVE) ×4 IMPLANT
GLOVE BIOGEL PI IND STRL 6 (GLOVE) IMPLANT
GLOVE BIOGEL PI IND STRL 7.0 (GLOVE) IMPLANT
GLOVE BIOGEL PI INDICATOR 6 (GLOVE) ×4
GLOVE BIOGEL PI INDICATOR 7.0 (GLOVE) ×2
GLOVE SURG SIGNA 7.5 PF LTX (GLOVE) ×6 IMPLANT
GOWN STRL REUS W/ TWL LRG LVL3 (GOWN DISPOSABLE) ×2 IMPLANT
GOWN STRL REUS W/ TWL XL LVL3 (GOWN DISPOSABLE) ×2 IMPLANT
GOWN STRL REUS W/TWL LRG LVL3 (GOWN DISPOSABLE) ×6
GOWN STRL REUS W/TWL XL LVL3 (GOWN DISPOSABLE) ×6
HANDLE STAPLE ENDO GIA SHORT (STAPLE)
HEMOSTAT SURGICEL 2X14 (HEMOSTASIS) IMPLANT
KIT BASIN OR (CUSTOM PROCEDURE TRAY) ×3 IMPLANT
KIT ROOM TURNOVER OR (KITS) ×3 IMPLANT
KIT SUCTION CATH 14FR (SUCTIONS) ×3 IMPLANT
LIQUID BAND (GAUZE/BANDAGES/DRESSINGS) ×2 IMPLANT
NDL BIOPSY 14X6 SOFT TISS (NEEDLE) IMPLANT
NEEDLE BIOPSY 14X6 SOFT TISS (NEEDLE) ×3 IMPLANT
NS IRRIG 1000ML POUR BTL (IV SOLUTION) ×9 IMPLANT
PACK CHEST (CUSTOM PROCEDURE TRAY) ×3 IMPLANT
PAD ARMBOARD 7.5X6 YLW CONV (MISCELLANEOUS) ×6 IMPLANT
POUCH ENDO CATCH II 15MM (MISCELLANEOUS) IMPLANT
POUCH SPECIMEN RETRIEVAL 10MM (ENDOMECHANICALS) IMPLANT
RELOAD STAPLE 35X2.5 WHT THIN (STAPLE) IMPLANT
RELOAD STAPLE 45 GOLD REG/THCK (STAPLE) IMPLANT
SCISSORS ENDO CVD 5DCS (MISCELLANEOUS) IMPLANT
SEALANT PROGEL (MISCELLANEOUS) IMPLANT
SEALANT SURG COSEAL 4ML (VASCULAR PRODUCTS) IMPLANT
SEALANT SURG COSEAL 8ML (VASCULAR PRODUCTS) ×2 IMPLANT
SHEARS HARMONIC HDI 36CM (ELECTROSURGICAL) ×2 IMPLANT
SOLUTION ANTI FOG 6CC (MISCELLANEOUS) ×3 IMPLANT
SPECIMEN JAR MEDIUM (MISCELLANEOUS) ×3 IMPLANT
SPONGE GAUZE 4X4 12PLY STER LF (GAUZE/BANDAGES/DRESSINGS) ×2 IMPLANT
SPONGE INTESTINAL PEANUT (DISPOSABLE) ×6 IMPLANT
SPONGE TONSIL 1 RF SGL (DISPOSABLE) ×3 IMPLANT
STAPLE RELOAD 2.5MM WHITE (STAPLE) ×6 IMPLANT
STAPLE RELOAD 45MM GOLD (STAPLE) ×9 IMPLANT
STAPLER ENDO GIA 12 SHRT THIN (STAPLE) IMPLANT
STAPLER ENDO GIA 12MM SHORT (STAPLE) IMPLANT
SUT PROLENE 4 0 RB 1 (SUTURE)
SUT PROLENE 4-0 RB1 .5 CRCL 36 (SUTURE) IMPLANT
SUT SILK  1 MH (SUTURE) ×2
SUT SILK 1 MH (SUTURE) ×1 IMPLANT
SUT SILK 1 TIES 10X30 (SUTURE) ×3 IMPLANT
SUT SILK 2 0 SH (SUTURE) IMPLANT
SUT SILK 2 0SH CR/8 30 (SUTURE) IMPLANT
SUT SILK 3 0 SH 30 (SUTURE) IMPLANT
SUT SILK 3 0 SH CR/8 (SUTURE) ×2 IMPLANT
SUT SILK 3 0SH CR/8 30 (SUTURE) IMPLANT
SUT VIC AB 0 CTX 27 (SUTURE) IMPLANT
SUT VIC AB 1 CTX 27 (SUTURE) ×3 IMPLANT
SUT VIC AB 2-0 CT1 27 (SUTURE)
SUT VIC AB 2-0 CT1 TAPERPNT 27 (SUTURE) IMPLANT
SUT VIC AB 2-0 CTX 36 (SUTURE) ×3 IMPLANT
SUT VIC AB 3-0 MH 27 (SUTURE) IMPLANT
SUT VIC AB 3-0 SH 27 (SUTURE)
SUT VIC AB 3-0 SH 27X BRD (SUTURE) IMPLANT
SUT VIC AB 3-0 X1 27 (SUTURE) ×3 IMPLANT
SUT VICRYL 0 UR6 27IN ABS (SUTURE) ×6 IMPLANT
SUT VICRYL 2 TP 1 (SUTURE) IMPLANT
SWAB COLLECTION DEVICE MRSA (MISCELLANEOUS) IMPLANT
SYRINGE 10CC LL (SYRINGE) ×3 IMPLANT
SYSTEM SAHARA CHEST DRAIN ATS (WOUND CARE) ×3 IMPLANT
TAPE CLOTH SURG 4X10 WHT LF (GAUZE/BANDAGES/DRESSINGS) ×2 IMPLANT
TIP APPLICATOR SPRAY EXTEND 16 (VASCULAR PRODUCTS) IMPLANT
TOWEL OR 17X24 6PK STRL BLUE (TOWEL DISPOSABLE) ×3 IMPLANT
TOWEL OR 17X26 10 PK STRL BLUE (TOWEL DISPOSABLE) ×6 IMPLANT
TRAP SPECIMEN MUCOUS 40CC (MISCELLANEOUS) IMPLANT
TRAY FOLEY CATH 16FRSI W/METER (SET/KITS/TRAYS/PACK) ×3 IMPLANT
TROCAR XCEL BLADELESS 5X75MML (TROCAR) ×5 IMPLANT
TROCAR XCEL NON-BLD 5MMX100MML (ENDOMECHANICALS) IMPLANT
TUBE ANAEROBIC SPECIMEN COL (MISCELLANEOUS) IMPLANT
TUNNELER SHEATH ON-Q 11GX8 DSP (PAIN MANAGEMENT) ×2 IMPLANT
WATER STERILE IRR 1000ML POUR (IV SOLUTION) ×6 IMPLANT

## 2016-02-13 NOTE — Transfer of Care (Signed)
Immediate Anesthesia Transfer of Care Note  Patient: William [name]  Procedure(s) Performed: Procedure(s): VIDEO ASSISTED THORACOSCOPY (VATS)/LUNG RESECTION with multiple biopsies (Right)  Patient Location: PACU  Anesthesia Type:General  Level of Consciousness: awake, alert , oriented and sedated  Airway & Oxygen Therapy: Patient Spontanous Breathing and Patient connected to face mask oxygen  Post-op Assessment: Report given to RN, Post -op Vital signs reviewed and stable and Patient moving all extremities X 4  Post vital signs: Reviewed and stable  Last Vitals:  Vitals:   02/13/16 0650 02/13/16 1213  BP: 130/87   Pulse: (!) 59   Resp: 18   Temp: 36.7 C (P) 36.3 C    Last Pain:  Vitals:   02/13/16 0650  TempSrc: Oral         Complications: No apparent anesthesia complications

## 2016-02-13 NOTE — Op Note (Signed)
William [name], William [name]                 ACCOUNT NO.:  [id number]  MEDICAL RECORD NO.:  [id number]  LOCATION:                                 FACILITY:  PHYSICIAN:  Revonda Standard. Roxan Hockey, M.D.DATE OF BIRTH:  1970/04/22  DATE OF PROCEDURE:  02/13/2016 DATE OF DISCHARGE:                              OPERATIVE REPORT   PREOPERATIVE DIAGNOSIS:  Right lung mass.  POSTOPERATIVE DIAGNOSIS:  Right hilar adenopathy.  PROCEDURE:  Right VATS with resection of hilar lymph nodes and On-Q local anesthetic catheter placement.  SURGEON:  Revonda Standard. Roxan Hockey, [place]:  John Giovanni, PA-C.  ANESTHESIA:  General.  FINDINGS:  Enlarged node at the confluence of the major and minor fissures adjacent to but not invading the main pulmonary artery. Enlarged level 12 node associated with right middle lobe.  Normal appearing level 9 lymph node.  Frozen section of level 12 node revealed lymphoid cells and no evidence of metastatic carcinoma.  Needle biopsy of central mass showed lymphoid cells.  CLINICAL NOTE:  Mr. Bradfield is a 46 year old gentleman who presented last fall with difficulty breathing.  A chest x-ray showed right hilar mass. CT showed a 2.5 cm right hilar mass at the confluence of the major and minor fissures.  A PET-CT showed the lesion was hypermetabolic. Bronchoscopy was nondiagnostic.  The patient was advised to undergo surgical resection with potential lobectomy or pneumonectomy depending on intraoperative findings.  The indications, risks, benefits, and alternatives were discussed in detail with the patient.  He understood and accepted the risks and agreed to proceed.  DESCRIPTION OF PROCEDURE:  Mr. Lebaron was brought to the preoperative holding area on February 13, 2016.  Anesthesia placed a central line and arterial blood pressure monitoring line.  He was taken to the operating room, anesthetized, and intubated with a double-lumen endotracheal tube. Intravenous antibiotics  were administered.  Sequential compression devices were placed on the calves for DVT prophylaxis.  A Foley catheter was placed.  He was placed in the left lateral decubitus position and the right chest was prepped and draped in the usual sterile fashion. Single lung ventilation of the left lung was initiated and was tolerated well throughout the procedure. After performing a time-out, an incision was made in the seventh intercostal space in the midaxillary line.  A 5 mm port was inserted into the chest.  The thoracoscope was advanced into the chest.  There was no visible abnormality of the visceral or parietal pleura and no pleural effusion.  A working incision was made in the fifth interspace anterolaterally.  No rib spreading was performed during the procedure. The fissure was inspected.  The major fissure was relatively complete anteriorly and incomplete posteriorly.  The minor fissure was incomplete.  The mass was clearly visible.  It was clear initially that it was not involving the right lower lobe.  It was unclear whether it was involving the upper and middle lobe.  The pleura overlying the inferior aspect of the mass and the major fissure between the lower and middle lobes were divided.  An enlarged level 12 node associated with the right middle lobe was removed.  It was dissected out  and sent for frozen section which returned showing only lymphatic tissue with no metastatic carcinoma.  Decision was made to do a Tru-Cut needle biopsy of the primary mass to determine whether this was a carcinoid tumor or lymph node.  This was done with a Tru-Cut needle.  Multiple biopsies were taken.  These were sent for frozen section.  While awaiting those results, the inferior pulmonary ligament was divided with Harmonic Scalpel.  A level 9 lymph node was encountered.  This was of normal size and appearance.  It was sent for permanent pathology.  The pleura was divided at the hilum anteriorly  and posteriorly.  But at this point, the frozen section returned showing lymphoid cells.  This was not a parenchymal tumor.  The decision was made to remove the mass, but without any lung resection.  During exposure of the inferior portion of the mass, a small portion of the major fissure posteriorly was divided with a stapler.  This was an Echelon 45-mm stapler with a gold cartridge.  The mass was adjacent to, but not invasive into the main pulmonary artery.  It was gently dissected off the main pulmonary artery.  The right middle lobe branches, superior segmental branch, basilar trunk of the lower lobe, and the posterior ascending of the upper lobe were all identified during the dissection and preserved.  As the nodal mass was freed up, there was a relatively long attachment to the upper lobe. To avoid air leaks, this was divided with an endoscopic stapler again using the gold cartridges.  The node was removed and sent to pathology with attention to lymphoma workup.  Final inspection was made for hemostasis.  The chest was irrigated with warm saline.  CoSeal was applied to areas of the parenchyma that had no overlying pleura.  An On-Q local anesthetic catheter was tunneled through a separate stab incision posteriorly.  It was tunneled into a subpleural location and primed with 5 mL of 0.5% Marcaine.  A 28-French chest tube was placed through the original port incision and secured with #1 silk suture.  The right lung was reinflated.  There was good inflation of all 3 lobes. The working incision was closed in standard fashion in 3 layers.  The chest tube was placed to suction.  The patient was placed back in a supine position.  He was extubated in the operating room and taken to the postanesthetic care unit in good condition.     Revonda Standard Roxan Hockey, M.D.     SCH/MEDQ  D:  02/13/2016  T:  02/13/2016  Job:  [id number]

## 2016-02-13 NOTE — Anesthesia Postprocedure Evaluation (Signed)
Anesthesia Post Note  Patient: William [name]  Procedure(s) Performed: Procedure(s) (LRB): VIDEO ASSISTED THORACOSCOPY (VATS)/LUNG RESECTION with multiple biopsies (Right)  Patient location during evaluation: PACU Anesthesia Type: General Level of consciousness: awake and alert and patient cooperative Pain management: pain level controlled Vital Signs Assessment: post-procedure vital signs reviewed and stable Respiratory status: spontaneous breathing and respiratory function stable Cardiovascular status: stable Anesthetic complications: no       Last Vitals:  Vitals:   02/13/16 1326 02/13/16 1341  BP: 129/81 129/74  Pulse: 68 66  Resp: 11 (!) 6  Temp:      Last Pain:  Vitals:   02/13/16 1345  TempSrc:   PainSc: [place]

## 2016-02-13 NOTE — Progress Notes (Signed)
Patient diet advanced per his request, clear liquids given at approximately 1500. Tolerated well initially but has now had 1 episode of vomiting. Have removed all liquids and will attempt ice chips at a later time this shift. Have given zofran PRN.

## 2016-02-13 NOTE — Anesthesia Procedure Notes (Addendum)
Procedure Name: Intubation Date/Time: 02/13/2016 8:41 AM Performed by: Gaylene Brooks Pre-anesthesia Checklist: Patient identified, Emergency Drugs available, Suction available, Patient being monitored and Timeout performed Patient Re-evaluated:Patient Re-evaluated prior to inductionOxygen Delivery Method: Circle system utilized Preoxygenation: Pre-oxygenation with 100% oxygen Intubation Type: IV induction Laryngoscope Size: Glidescope and 4 Grade View: Grade I Endobronchial tube: Left, EBT position confirmed by auscultation, EBT position confirmed by fiberoptic bronchoscope and Double lumen EBT and 39 Fr Number of attempts: 1 Airway Equipment and Method: Video-laryngoscopy Placement Confirmation: positive ETCO2,  breath sounds checked- equal and bilateral and ETT inserted through vocal cords under direct vision Tube secured with: Tape Dental Injury: Teeth and Oropharynx as per pre-operative assessment  Difficulty Due To: Difficulty was anticipated, Difficult Airway- due to limited oral opening, Difficult Airway- due to anterior larynx and Difficult Airway- due to reduced neck mobility

## 2016-02-13 NOTE — Anesthesia Procedure Notes (Signed)
Anesthesia Procedure Note RIJ CVP Dual Lumen:[id number]: The patient was identified and consent obtained.  TO was performed, and full barrier precautions were used.  The skin was anesthetized with lidocaine.  Once the vein was located with the 22 ga. needle using ultrasound guidance , the wire was inserted into the vein.  The wire location was confirmed with ultrasound.  The tissue was dilated and the catheter was carefully inserted, then sutured in place. A dressing was applied. The patient tolerated the procedure well.

## 2016-02-13 NOTE — H&P (View-Only) (Signed)
[address]       [place] [address]             [phone]       HPI: Mr. Birdsell returns for another visit to further discuss management of his right lung mass.   Mr. Liberti is a 46 year old man who was found to have a right hilar mass on chest x-ray after presenting to the emergency room with difficulty breathing. This occurred after he was hit in the nose and face with a baseball. A CT of the chest showed a 2.5 cm right hilar mass complements of the major and minor fissures. Dr. Lamonte Sakai did a PET/CT which showed the lesion was hypermetabolic. He did navigational bronchoscopy and endobronchial ultrasound which was nondiagnostic.  I saw him in consultation on 01/03/2016 and we discussed options. We discussed the repeated attempts to biopsy versus surgical resection. I recommended we proceed with surgical resection. He was reluctant to have surgery and needed some time to think about his options.  He now returns. He says he feels he feels well. He has an occasional sharp substernal pain that he thinks may be indigestion. He is not having any shortness of breath or wheezing, cough or hemoptysis.  Zubrod Score: At the time of surgery this patient's most appropriate activity status/level should be described as: [x]     0    Normal activity, no symptoms []     1    Restricted in physical strenuous activity but ambulatory, able to do out light work []     2    Ambulatory and capable of self care, unable to do work activities, up and about >50 % of waking hours                              []     3    Only limited self care, in bed greater than 50% of waking hours []     4    Completely disabled, no self care, confined to bed or chair []     5    Moribund   Past Medical History:  Diagnosis Date  . Anxiety    situational  . Deviated nasal septum   . Hypertension    Past Surgical History:  Procedure Laterality Date  . BACK SURGERY  2002ish   moicrodisectomy  .  ENDOBRONCHIAL ULTRASOUND N/A 11/21/2015   Procedure: ENDOBRONCHIAL ULTRASOUND;  Surgeon: Collene Gobble, MD;  Location: [facility];  Service: Thoracic;  Laterality: N/A;  . VIDEO BRONCHOSCOPY WITH ENDOBRONCHIAL NAVIGATION N/A 11/21/2015   Procedure: VIDEO BRONCHOSCOPY WITH ENDOBRONCHIAL NAVIGATION;  Surgeon: Collene Gobble, MD;  Location: [facility];  Service: Thoracic;  Laterality: N/A;    Current Outpatient Prescriptions  Medication Sig Dispense Refill  . ALPRAZolam (XANAX) 0.5 MG tablet Take 1 tablet (0.5 mg total) by mouth 2 (two) times daily as needed for anxiety. 60 tablet 0  . fluticasone (FLONASE) 50 MCG/ACT nasal spray Place 1-2 sprays into both nostrils daily.  0  . omeprazole (PRILOSEC) 40 MG capsule Take 40 mg by mouth daily as needed (heartburn/ eating spicy foods).   3  . predniSONE (STERAPRED UNI-PAK 21 TAB) 5 MG (21) TBPK tablet Take 5 mg by mouth daily.     No current facility-administered medications for this visit.     Physical Exam BP (!) 141/95 (BP Location: Right Arm, Patient Position: Sitting, Cuff Size: Large)  Pulse 72   Resp 16   Ht 5' 10.5" (1.791 m)   Wt 249 lb (112.9 kg)   SpO2 99% Comment: on RA  BMI 35.41 kg/m  46 year old man in no acute distress Alert and oriented 3 with no focal deficits Well-developed and well-nourished Anxious No cervical or supraclavicular adenopathy Cardiac regular rate and rhythm normal S1 and S2 Lungs clear with equal breath sound bilaterally Abdomen soft nontender Extremities without clubbing cyanosis or edema  Diagnostic Tests: CT CHEST WITHOUT CONTRAST  TECHNIQUE: Multidetector CT imaging of the chest was performed using thin slice collimation for electromagnetic bronchoscopy planning purposes, without intravenous contrast.  COMPARISON:  10/31/2015 PET-CT.  10/13/2015 chest CT.  FINDINGS: Cardiovascular: Normal heart size. No significant pericardial fluid/thickening. Great vessels are normal in course and  caliber.  Mediastinum/Nodes: No discrete thyroid nodules. Unremarkable esophagus. No pathologically enlarged axillary, mediastinal or gross hilar lymph nodes, noting limited sensitivity for the detection of hilar adenopathy on this noncontrast study.  Lungs/Pleura: No pneumothorax. No pleural effusion. Central right mid lung 2.7 x 2.1 cm pulmonary nodule, intimately associated with the confluence of the minor and major fissures and abutting the right hilum, uncertain lobe of origin, previously 2.7 x 2.1 cm on 10/14/2015 using similar measurement technique, stable. Calcified 3 mm right upper lobe granuloma. No acute consolidative airspace disease, lung masses or additional significant pulmonary nodules.  Upper abdomen: Mild diffuse hepatic steatosis.  Musculoskeletal: No aggressive appearing focal osseous lesions. Mild thoracic spondylosis.  IMPRESSION: 1. Stable central right mid lung 2.7 cm pulmonary nodule, intimately associated with the confluence of the minor and major fissures and abutting the right hilum, uncertain lobe of origin. Primary bronchogenic carcinoma is the diagnosis of exclusion given hypermetabolism on the recent PET-CT study. 2. No evidence of metastatic disease in the chest. 3. Mild diffuse hepatic steatosis.   Electronically Signed   By: Ilona Sorrel M.D.   On: 11/16/2015 12:26   NUCLEAR MEDICINE PET SKULL BASE TO THIGH  TECHNIQUE: 12.4 mCi F-18 FDG was injected intravenously. Full-ring PET imaging was performed from the skull base to thigh after the radiotracer. CT data was obtained and used for attenuation correction and anatomic localization.  FASTING BLOOD GLUCOSE:  Value: 104 mg/dl  COMPARISON:  CT on 10/13/2015  FINDINGS: NECK  10 mm right level 2 upper jugular lymph node is seen on image 30/4 which shows FDG uptake with SUV max of 3.9. No other hypermetabolic cervical lymph nodes are identified  CHEST  2.5 cm soft  tissue nodule abutting the right hilum is hypermetabolic, with SUV max of 5.6. No other hypermetabolic lymph nodes or pulmonary nodules identified. No evidence of pleural effusion.  ABDOMEN/PELVIS  No abnormal hypermetabolic activity within the liver, pancreas, adrenal glands, or spleen. No hypermetabolic lymph nodes in the abdomen or pelvis. Mild hepatic steatosis noted.  SKELETON  No focal hypermetabolic activity to suggest skeletal metastasis.  IMPRESSION: 2.5 cm hypermetabolic nodule in central right lung abutting the right hilum, suspicious for bronchogenic carcinoma. Bronchoscopy should be considered for tissue diagnosis.  10 mm hypermetabolic right cervical level 2 upper jugular lymph node. Differential diagnosis includes reactive lymphadenopathy and metastatic disease.  No evidence of metastatic disease within the abdomen or pelvis.   Electronically Signed   By: Earle Gell M.D.   On: 10/31/2015 11:23   I personally reviewed his CT and PET CT again and concur with the findings noted above.  Impression: Mr. Zombek is a 46 year old man with a hypermetabolic mass in  the right hilum confluence of the major and minor fissures. It is unclear what the lobe of origin is based on the scans. This lesion is hypermetabolic on PET/CT. Radiology favors this being a primary bronchogenic carcinoma, although I think carcinoid is as likely a possibility. In either case I think the mass needs to come out. Even if its a well-differentiated carcinoid, it is an a crucial location and if it continues to get bigger could cause serious problems. It will only get more difficult to take out later on. Obviously if it is cancer it needs to come out as soon as possible.  I described the proposed operation again to Mr. Sparaco. He understands this would be a right video-assisted thoracoscopy with possible thoracotomy for pulmonary resection which could include lobectomy, bilobectomy, or  pneumonectomy. I assured him that I will do my best to preserve as much lung function as possible and a sleeve resection may be needed to do so depending on the underlying pathology. I informed him of the indications, risks, benefits, and alternatives previously. He understands the risks include, but are not limited to death, MI, DVT, PE, bleeding,, possible need for transfusion, infection, air leaks, arrhythmias, as well as other unforeseeable complications. His primary concerns are his quality of life and activity levels postoperatively. I don't think it even a pneumonectomy would leave him with an unacceptable quality-of-life although it would likely limit him in terms of his physical activities. Impossible to predict beforehand what degree that will effect any individual. If we can do it with less than a pneumonectomy suspect that he will regain nearly full function.  After going through his questions she has decided to proceed. We will plan for surgery on Wednesday, 02/13/2016. We will need to repeat his chest x-ray to make shows not been any significant change. He does need pulmonary function testing although I don't think that will be an issue is his case as he is a nonsmoker.  Plan: Right VATS pulmonary resection on Wednesday, 02/13/2016  Melrose Nakayama, MD [facility] [phone]

## 2016-02-13 NOTE — Brief Op Note (Addendum)
02/13/2016  11:48 AM  PATIENT:  William [name]  46 y.o. male  PRE-OPERATIVE DIAGNOSIS:  RIGHT LUNG MASS  POST-OPERATIVE DIAGNOSIS:  RIGHT HILAR ADENOPATHY  PROCEDURE: RIGHT VATS WITH RESECTION OF HILAR LYMPH NODES  SURGEON:  Surgeon(s) and Role:    * Melrose Nakayama, MD - Primary  PHYSICIAN ASSISTANT: WAYNE GOLD PA-C  ANESTHESIA:   general  EBL:  Total I/O In: 1000 [I.V.:1000] Out: 545 [Urine:395; Blood:150]  BLOOD ADMINISTERED:none  DRAINS: 1  Chest Tube(s) in the RIGHT HEMITHORAX   LOCAL MEDICATIONS USED:  MARCAINE     SPECIMEN:  Source of Specimen:  RIGHT HILAR MASS , LN SAMPLES  DISPOSITION OF SPECIMEN:  PATHOLOGY  COUNTS:  YES  PLAN OF CARE: Admit to inpatient   PATIENT DISPOSITION:  PACU - hemodynamically stable.   Delay start of Pharmacological VTE agent (>24hrs) due to surgical blood loss or risk of bleeding: yes  FROZEN- LYMPHOID TISSUE  COMPLICATIONS: NO KNOWN  Enlarged lymph nodes overlying PA at confluence of major and minor fissures. No parenchymal lung mass.

## 2016-02-13 NOTE — Interval H&P Note (Signed)
History and Physical Interval Note:  02/13/2016 8:02 AM  William [name]  has presented today for surgery, with the diagnosis of RIGHT LUNG MASS  The various methods of treatment have been discussed with the patient and family. After consideration of risks, benefits and other options for treatment, the patient has consented to  Procedure(s): VIDEO ASSISTED THORACOSCOPY (VATS)/LUNG RESECTION (Right) as a surgical intervention .  The patient's history has been reviewed, patient examined, no change in status, stable for surgery.  I have reviewed the patient's chart and labs.  Questions were answered to the patient's satisfaction.     Melrose Nakayama

## 2016-02-13 NOTE — Anesthesia Preprocedure Evaluation (Signed)
Anesthesia Evaluation  Patient identified by MRN, date of birth, ID band Patient awake    Reviewed: Allergy & Precautions, H&P , NPO status , Patient's Chart, lab work & pertinent test results  Airway Mallampati: II   Neck ROM: full    Dental   Pulmonary sleep apnea ,  Right lung mass   breath sounds clear to auscultation       Cardiovascular hypertension,  Rhythm:regular Rate:Normal     Neuro/Psych Anxiety    GI/Hepatic GERD  ,  Endo/Other    Renal/GU      Musculoskeletal   Abdominal   Peds  Hematology   Anesthesia Other Findings   Reproductive/Obstetrics                             Anesthesia Physical Anesthesia Plan  ASA: II  Anesthesia Plan: General   Post-op Pain Management:    Induction: Intravenous  Airway Management Planned: Double Lumen EBT  Additional Equipment: Arterial line and CVP  Intra-op Plan:   Post-operative Plan: Extubation in OR  Informed Consent: I have reviewed the patients History and Physical, chart, labs and discussed the procedure including the risks, benefits and alternatives for the proposed anesthesia with the patient or authorized representative who has indicated his/her understanding and acceptance.     Plan Discussed with: CRNA, Anesthesiologist and Surgeon  Anesthesia Plan Comments:         Anesthesia Quick Evaluation

## 2016-02-14 ENCOUNTER — Inpatient Hospital Stay (HOSPITAL_COMMUNITY): Payer: No Typology Code available for payment source

## 2016-02-14 ENCOUNTER — Encounter (HOSPITAL_COMMUNITY): Payer: Self-pay | Admitting: Thoracic Surgery (Cardiothoracic Vascular Surgery)

## 2016-02-14 LAB — CBC
HEMATOCRIT: 28.9 % — AB (ref 39.0–52.0)
Hemoglobin: 9.3 g/dL — ABNORMAL LOW (ref 13.0–17.0)
MCH: 28.4 pg (ref 26.0–34.0)
MCHC: 32.2 g/dL (ref 30.0–36.0)
MCV: 88.4 fL (ref 78.0–100.0)
Platelets: 104 10*3/uL — ABNORMAL LOW (ref 150–400)
RBC: 3.27 MIL/uL — ABNORMAL LOW (ref 4.22–5.81)
RDW: 13.2 % (ref 11.5–15.5)
WBC: 6.3 10*3/uL (ref 4.0–10.5)

## 2016-02-14 LAB — BLOOD GAS, ARTERIAL
Acid-Base Excess: 3.1 mmol/L — ABNORMAL HIGH (ref 0.0–2.0)
BICARBONATE: 27.3 mmol/L (ref 20.0–28.0)
Drawn by: 364961
FIO2: 28
O2 Saturation: 98.6 %
PCO2 ART: 43.3 mmHg (ref 32.0–48.0)
PH ART: 7.416 (ref 7.350–7.450)
Patient temperature: 98.6
pO2, Arterial: 123 mmHg — ABNORMAL HIGH (ref 83.0–108.0)

## 2016-02-14 LAB — BASIC METABOLIC PANEL
ANION GAP: 6 (ref 5–15)
BUN: 7 mg/dL (ref 6–20)
CHLORIDE: 117 mmol/L — AB (ref 101–111)
CO2: 17 mmol/L — ABNORMAL LOW (ref 22–32)
Calcium: 4.9 mg/dL — CL (ref 8.9–10.3)
Creatinine, Ser: 0.48 mg/dL — ABNORMAL LOW (ref 0.61–1.24)
GFR calc Af Amer: >60 mL/min (ref >60)
GFR calc non Af Amer: >60 mL/min (ref >60)
GLUCOSE: 73 mg/dL (ref 65–99)
POTASSIUM: 2.9 mmol/L — AB (ref 3.5–5.1)
Sodium: 140 mmol/L (ref 135–145)

## 2016-02-14 MED ORDER — POTASSIUM CHLORIDE CRYS ER 20 MEQ PO TBCR
40.0000 meq | EXTENDED_RELEASE_TABLET | Freq: Once | ORAL | Status: AC
Start: 2016-02-14 — End: 2016-02-14
  Administered 2016-02-14: 40 meq via ORAL
  Filled 2016-02-14: qty 2

## 2016-02-14 NOTE — Discharge Summary (Signed)
Physician Discharge Summary  Patient ID: William [name] MRN: [id number] DOB/AGE: [birth year] 46 y.o.  Admit date: 02/13/2016 Discharge date: 02/15/2016  Admission Diagnoses: Hilar mass  Discharge Diagnoses:  Active Problems:   Adenopathy, hilar  Patient Active Problem List   Diagnosis Date Noted  . Adenopathy, hilar 02/13/2016  . Chest pain 10/14/2015  . Lung mass 10/14/2015  . History of snoring 10/14/2015  . Hypertension   . Dyspnea    HPI:  William [name] is a 46 year old man who was found to have a right hilar mass on chest x-ray after presenting to the emergency room with difficulty breathing. This occurred after he was hit in the nose and face with a baseball. A CT of the chest showed a 2.5 cm right hilar mass complements of the major and minor fissures. Dr. Lamonte Sakai did a PET/CT which showed the lesion was hypermetabolic. He did navigational bronchoscopy and endobronchial ultrasound which was nondiagnostic.  I saw him in consultation on 01/03/2016 and we discussed options. We discussed the repeated attempts to biopsy versus surgical resection. I recommended we proceed with surgical resection. He was reluctant to have surgery and needed some time to think about his options.  He now returns. He says he feels he feels well. He has an occasional sharp substernal pain that he thinks may be indigestion. He is not having any shortness of breath or wheezing, cough or hemoptysis.  The patient was admitted electively for resection.  PATHOLOGY: pending  Discharged Condition: good  Hospital Course: The patient was admitted electively and taken to the operating room on 02/13/2016 where he underwent the below described procedure. He tolerated well was taken to the postanesthesia care unit in good condition.  Postoperative hospital course:  Overall the patient has done well. He is maintained stable hemodynamics. He does have an expected acute blood loss anemia. All routine lines, monitors and  drainage devices have been discontinued in the standard fashion. Incisions are healing well without evidence of infection. He is tolerating gradually increasing activities using standard protocols. Oxygen has been weaned and he maintains good saturations on room air. He is felt to be quite stable at time of discharge.  Consults: None  Significant Diagnostic Studies: routine   Treatments: surgery:  DATE OF PROCEDURE:  02/13/2016 DATE OF DISCHARGE:                              OPERATIVE REPORT   PREOPERATIVE DIAGNOSIS:  Right lung mass.  POSTOPERATIVE DIAGNOSIS:  Right hilar adenopathy.  PROCEDURE:  Right VATS with resection of hilar lymph nodes and On-Q local anesthetic catheter placement.  SURGEON:  Revonda Standard. Roxan Hockey, Felt:  William Giovanni, PA-C.  ANESTHESIA:  General.  Disposition: 01-Home or Self Care    Discharge Medications:   Allergies as of 02/15/2016   No Known Allergies     Medication List    TAKE these medications   acetaminophen 500 MG tablet Commonly known as:  TYLENOL Take 2 tablets (1,000 mg total) by mouth every 6 (six) hours as needed for mild pain or fever.   ALPRAZolam 0.5 MG tablet Commonly known as:  XANAX Take 1 tablet (0.5 mg total) by mouth 2 (two) times daily as needed for anxiety.   BC FAST PAIN RELIEF PO Take 1 packet by mouth every 8 (eight) hours as needed (for pain relief.).   fluticasone 50 MCG/ACT nasal spray Commonly known as:  [place]  1-2 sprays into both nostrils daily as needed (for nasal congestion.).   omeprazole 40 MG capsule Commonly known as:  PRILOSEC Take 40 mg by mouth daily as needed (heartburn/eating spicy foods).   REWETTING DROPS Soln Apply 1-2 drops to eye 2 (two) times daily as needed (for dry eyes).   SINUS RINSE NA Place 1 application into the nose 2 (two) times daily.   traMADol 50 MG tablet Commonly known as:  ULTRAM Take 1-2 tablets (50-100 mg total) by mouth every 6 (six)  hours as needed (moderate to severe pain).      Follow-up Information    Melrose Nakayama, MD Follow up on 02/26/2016.   Specialty:  Cardiothoracic Surgery Why:  Appointment is at 4:00 Please obtain a chest xray from [facility] 1/2 hour prior to seeing the surgeon. It is located in the same office complex. Contact information: [address] [place] [address] [phone]        [facility] Follow up on 02/15/2016.   Specialty:  Cardiothoracic Surgery Why:  Appointment is at 10:30 for suture removal Contact information: [address] [place], [place] [phone]          Signed: Ellwood Handler 02/15/2016, 9:17 AM

## 2016-02-14 NOTE — Care Management Note (Signed)
Case Management Note  Patient Details  Name: William [name] MRN: [id number] Date of Birth: [birth year]  Subjective/Objective:  S/p VATS, lung resection, conts with chest tube x 1 to suction, replete K.  NCM will cont to follow for dc needs.                  Action/Plan:   Expected Discharge Date:                  Expected Discharge Plan:  Home/Self Care  In-House Referral:     Discharge planning Services  CM Consult  Post Acute Care Choice:    Choice offered to:     DME Arranged:    DME Agency:     HH Arranged:    HH Agency:     Status of Service:  In process, will continue to follow  If discussed at Long Length of Stay Meetings, dates discussed:    Additional Comments:  [name], RN 02/14/2016, 12:50 PM

## 2016-02-14 NOTE — Progress Notes (Signed)
Pt CT d/c per MD order, pt tol well, pt used IS per protocol, RAD called for CXR to be done

## 2016-02-14 NOTE — Progress Notes (Signed)
Fentanyl PCA had a d/c order  Waste 32ml with Rondel Baton RN  Patient received 75mcg from 7p-6a.  Patient did not want pain meds at the time PCA was d/c'd.

## 2016-02-14 NOTE — Progress Notes (Addendum)
[address]       [place] [address]             [phone]      1 Day Post-Op Procedure(s) (LRB): VIDEO ASSISTED THORACOSCOPY (VATS)/LUNG RESECTION with multiple biopsies (Right) Subjective: Feels pretty well  Objective: Vital signs in last 24 hours: Temp:  [97.3 F (36.3 C)-98.1 F (36.7 C)] 97.7 F (36.5 C) (02/08 0541) Pulse Rate:  [57-76] 57 (02/08 0541) Cardiac Rhythm: Normal sinus rhythm (02/08 0430) Resp:  [0-21] 15 (02/08 0541) BP: (113-153)/(67-91) 113/81 (02/08 0541) SpO2:  [97 %-100 %] 99 % (02/08 0541) Arterial Line BP: (131-158)/(58-81) 139/63 (02/07 1800)  Hemodynamic parameters for last 24 hours:    Intake/Output from previous day: 02/07 0701 - 02/08 0700 In: 2200.8 [I.V.:2200.8] Out: 2574 [Urine:2345; Emesis/NG output:1; Blood:150; Chest Tube:78] Intake/Output this shift: No intake/output data recorded.  General appearance: alert, cooperative and no distress Heart: regular rate and rhythm Lungs: clear to auscultation bilaterally Abdomen: benign Extremities: no edema Wound: dressings CDI  Lab Results:  Recent Labs  02/11/16 1441 02/14/16 0345  WBC 7.2 6.3  HGB 14.6 9.3*  HCT 44.0 28.9*  PLT 187 104*   BMET:  Recent Labs  02/11/16 1441 02/14/16 0345  NA 140 140  K 3.8 2.9*  CL 100* 117*  CO2 29 17*  GLUCOSE 92 73  BUN 11 7  CREATININE 1.07 0.48*  CALCIUM 9.6 4.9*    PT/INR:  Recent Labs  02/11/16 1441  LABPROT 12.9  INR 0.97   ABG    Component Value Date/Time   PHART 7.416 02/14/2016 0445   HCO3 27.3 02/14/2016 0445   O2SAT 98.6 02/14/2016 0445   CBG (last 3)  No results for input(s): GLUCAP in the last 72 hours.  Meds Scheduled Meds: . acetaminophen  1,000 mg Oral Q6H   Or  . acetaminophen (TYLENOL) oral liquid 160 mg/5 mL  1,000 mg Oral Q6H  . bisacodyl  10 mg Oral Daily  . cefUROXime (ZINACEF)  IV  1.5 g Intravenous Q12H  . enoxaparin (LOVENOX) injection  40 mg Subcutaneous Daily  .  ketorolac  30 mg Intravenous Q6H  . pantoprazole  40 mg Oral Daily  . senna-docusate  1 tablet Oral QHS   Continuous Infusions: . dextrose 5 % and 0.9% NaCl 100 mL/hr at 02/14/16 0659   PRN Meds:.ALPRAZolam, diphenhydrAMINE **OR** diphenhydrAMINE, fluticasone, naloxone **AND** sodium chloride flush, ondansetron (ZOFRAN) IV, oxyCODONE, potassium chloride (KCL MULTIRUN) 30 mEq in 265 mL IVPB, traMADol  Xrays Dg Chest Port 1 View  Result Date: 02/13/2016 CLINICAL DATA:  Hilar adenopathy EXAM: PORTABLE CHEST 1 VIEW COMPARISON:  Chest radiograph 02/11/2016 FINDINGS: Right IJ approach central venous catheter tip is at the cavoatrial junction. There is a right-sided chest tube with tip near the mid mediastinum. The dominant right hilar node is been resected. There is shallow lung inflation. No pneumothorax or sizable pleural effusion. No focal consolidation. IMPRESSION: 1. Right IJ catheter tip at the cavoatrial junction. Chest tube tip near the mid mediastinum. 2. Status post resection of dominant right hilar node. Electronically Signed   By: Ulyses Jarred M.D.   On: 02/13/2016 13:32    Assessment/Plan: S/P Procedure(s) (LRB): VIDEO ASSISTED THORACOSCOPY (VATS)/LUNG RESECTION with multiple biopsies (Right)  1 doing well 2 Chest tube - min drainage, no air leak . Probably d/c chest tube soon 3 d/c foley 4 Replace K+ 5 ABL anemia- follow 6 routine pulm toilet/rehab  LOS: 1 day    GOLD,WAYNE E 02/14/2016 Patient seen and examined, agree with above Will place CT to water seal and recheck CXR at noon- dc CT if OK Ambulate  Remo Lipps C. Roxan Hockey, MD [facility] [phone]

## 2016-02-14 NOTE — Discharge Instructions (Signed)
Video-Assisted Thoracic Surgery, Care After °Refer to this sheet in the next few weeks. These instructions provide you with information on caring for yourself after your procedure. Your doctor may also give you more specific instructions. Your procedure has been planned according to current medical practices, but problems sometimes occur. Call your doctor if you have any problems or questions after your procedure. °Follow these instructions at home: °· Only take over-the-counter or prescription medicines as told by your doctor. °· Only take pain medicines (narcotics) as told by your doctor. °· Do not drive until your doctor says it is OK. Driving while taking pain medicines or soon after surgery can be dangerous. °· Avoid activities that use your chest muscles for at least 3-4 weeks. These include lifting heavy objects. °· Take deep breaths. This expands the lungs and protects against a lung infection (pneumonia). °· Do breathing exercises as told by your doctor. If you were given a device to help with breathing, use it as told by your doctor. °· You may resume a normal diet and activities when you feel you are able to or as told by your doctor. °· Do not take a bath until your doctor says it is OK. Use the shower instead. °· Keep the bandage (dressing) covering the area where the chest tube was put (incision site) dry for 48 hours. Remove the bandage after 48 hours unless there is new fluid on the bandage. °· Remove bandages as told by your doctor. °· Change bandages if necessary or as told by your doctor. °· Keep all follow-up doctor visits. It is important to see your doctor after surgery. You may need follow-up care and your condition may need to be watched. °Get help right away if: °· You have a fever. °· You have chest pain. °· You have a rash. °· You have shortness of breath. °· You have trouble breathing. °· You feel weak, lightheaded, dizzy, or faint. °· You feel a lot of pain near an area where a surgical  cut was made. °· The pain at an area where a surgical cut was made is getting worse. °· You notice bleeding, fluid, or pus coming from where a surgical cut was made. °· You notice irritation, puffiness (swelling), or redness near the surgical cut. °· There is a bad smell coming from from a bandage or from an area where a surgical cut was made. °· It feels like your heart is fluttering or beating rapidly. °· Your pain medicine does not relieve your pain. °This information is not intended to replace advice given to you by your health care provider. Make sure you discuss any questions you have with your health care provider. °Document Released: 04/19/2012 Document Revised: 05/31/2015 Document Reviewed: 02/10/2012 °Elsevier Interactive Patient Education © 2017 Elsevier Inc. ° °

## 2016-02-15 ENCOUNTER — Inpatient Hospital Stay (HOSPITAL_COMMUNITY): Payer: No Typology Code available for payment source

## 2016-02-15 LAB — COMPREHENSIVE METABOLIC PANEL
ALT: 35 U/L (ref 17–63)
AST: 33 U/L (ref 15–41)
Albumin: 3 g/dL — ABNORMAL LOW (ref 3.5–5.0)
Alkaline Phosphatase: 39 U/L (ref 38–126)
Anion gap: 9 (ref 5–15)
BILIRUBIN TOTAL: 0.7 mg/dL (ref 0.3–1.2)
BUN: 8 mg/dL (ref 6–20)
CHLORIDE: 101 mmol/L (ref 101–111)
CO2: 26 mmol/L (ref 22–32)
CREATININE: 0.91 mg/dL (ref 0.61–1.24)
Calcium: 8.3 mg/dL — ABNORMAL LOW (ref 8.9–10.3)
Glucose, Bld: 102 mg/dL — ABNORMAL HIGH (ref 65–99)
POTASSIUM: 3.6 mmol/L (ref 3.5–5.1)
Sodium: 136 mmol/L (ref 135–145)
TOTAL PROTEIN: 5.6 g/dL — AB (ref 6.5–8.1)

## 2016-02-15 LAB — CBC
HEMATOCRIT: 38.5 % — AB (ref 39.0–52.0)
Hemoglobin: 12.4 g/dL — ABNORMAL LOW (ref 13.0–17.0)
MCH: 28.7 pg (ref 26.0–34.0)
MCHC: 32.2 g/dL (ref 30.0–36.0)
MCV: 89.1 fL (ref 78.0–100.0)
PLATELETS: 146 10*3/uL — AB (ref 150–400)
RBC: 4.32 MIL/uL (ref 4.22–5.81)
RDW: 13.1 % (ref 11.5–15.5)
WBC: 6.8 10*3/uL (ref 4.0–10.5)

## 2016-02-15 MED ORDER — ACETAMINOPHEN 500 MG PO TABS: 1000.0000 mg | ORAL_TABLET | Freq: Four times a day (QID) | ORAL | 0 refills | Status: DC | PRN

## 2016-02-15 MED ORDER — TRAMADOL HCL 50 MG PO TABS: 50.0000 mg | ORAL_TABLET | Freq: Four times a day (QID) | ORAL | 0 refills | Status: DC | PRN

## 2016-02-15 NOTE — Progress Notes (Addendum)
[address]       [place] [address]             [phone]      2 Days Post-Op Procedure(s) (LRB): VIDEO ASSISTED THORACOSCOPY (VATS)/LUNG RESECTION with multiple biopsies (Right) Subjective: Feels well overall  Objective: Vital signs in last 24 hours: Temp:  [97.5 F (36.4 C)-98.5 F (36.9 C)] 98.1 F (36.7 C) (02/09 0325) Pulse Rate:  [70-85] 80 (02/09 0325) Cardiac Rhythm: Normal sinus rhythm (02/09 0719) Resp:  [14-24] 15 (02/09 0325) BP: (120-147)/(81-99) 120/84 (02/09 0325) SpO2:  [95 %-97 %] 96 % (02/09 0325)  Hemodynamic parameters for last 24 hours:    Intake/Output from previous day: 02/08 0701 - 02/09 0700 In: 417.8 [I.V.:417.8] Out: 690 [Urine:690] Intake/Output this shift: No intake/output data recorded.  General appearance: alert, cooperative and no distress Heart: regular rate and rhythm Lungs: clear to auscultation bilaterally Abdomen: benign Extremities: no edema Wound: incis healing well  Lab Results:  Recent Labs  02/14/16 0345 02/15/16 0347  WBC 6.3 6.8  HGB 9.3* 12.4*  HCT 28.9* 38.5*  PLT 104* 146*   BMET:  Recent Labs  02/14/16 0345 02/15/16 0347  NA 140 136  K 2.9* 3.6  CL 117* 101  CO2 17* 26  GLUCOSE 73 102*  BUN 7 8  CREATININE 0.48* 0.91  CALCIUM 4.9* 8.3*    PT/INR: No results for input(s): LABPROT, INR in the last 72 hours. ABG    Component Value Date/Time   PHART 7.416 02/14/2016 0445   HCO3 27.3 02/14/2016 0445   O2SAT 98.6 02/14/2016 0445   CBG (last 3)  No results for input(s): GLUCAP in the last 72 hours.  Meds Scheduled Meds: . acetaminophen  1,000 mg Oral Q6H   Or  . acetaminophen (TYLENOL) oral liquid 160 mg/5 mL  1,000 mg Oral Q6H  . bisacodyl  10 mg Oral Daily  . enoxaparin (LOVENOX) injection  40 mg Subcutaneous Daily  . pantoprazole  40 mg Oral Daily  . senna-docusate  1 tablet Oral QHS   Continuous Infusions: PRN Meds:.ALPRAZolam, diphenhydrAMINE **OR**  diphenhydrAMINE, fluticasone, naloxone **AND** sodium chloride flush, ondansetron (ZOFRAN) IV, oxyCODONE, potassium chloride (KCL MULTIRUN) 30 mEq in 265 mL IVPB, traMADol  Xrays Dg Chest Port 1 View  Result Date: 02/14/2016 CLINICAL DATA:  Right chest tube removal. EXAM: PORTABLE CHEST 1 VIEW COMPARISON:  Earlier the same day FINDINGS: Right chest tube is been removed in the interval. No evidence for residual right-sided pneumothorax. Stable right infrahilar density. Right IJ central line tip overlies the innominate vein confluence. Left lung is clear. Cardiopericardial silhouette is at upper limits of normal for size. The visualized bony structures of the thorax are intact. Telemetry leads overlie the chest. IMPRESSION: No evidence for residual pneumothorax after right chest tube removal. Electronically Signed   By: Misty Stanley M.D.   On: 02/14/2016 17:41   Dg Chest Port 1 View  Result Date: 02/14/2016 CLINICAL DATA:  Chest tube placed to water seal EXAM: PORTABLE CHEST 1 VIEW COMPARISON:  02/14/2016 FINDINGS: Right central line and right chest tube remain in place, unchanged. No pneumothorax. Mild right base atelectasis. Heart is mildly enlarged. IMPRESSION: Right chest tube in place without pneumothorax. Right base atelectasis. Electronically Signed   By: Rolm Baptise M.D.   On: 02/14/2016 12:10   Dg Chest Port 1 View  Result Date: 02/14/2016 CLINICAL DATA:  Continued surveillance post chest surgery. EXAM: PORTABLE CHEST 1 VIEW COMPARISON:  02/13/2016. FINDINGS: RIGHT IJ catheter good position. Cardiomegaly. RIGHT chest tube good position, with no pneumothorax. RIGHT basilar atelectasis. Small RIGHT effusion. IMPRESSION: Stable examination.  No pneumothorax. Electronically Signed   By: Staci Righter M.D.   On: 02/14/2016 08:30   Dg Chest Port 1 View  Result Date: 02/13/2016 CLINICAL DATA:  Hilar adenopathy EXAM: PORTABLE CHEST 1 VIEW COMPARISON:  Chest radiograph 02/11/2016 FINDINGS: Right IJ  approach central venous catheter tip is at the cavoatrial junction. There is a right-sided chest tube with tip near the mid mediastinum. The dominant right hilar node is been resected. There is shallow lung inflation. No pneumothorax or sizable pleural effusion. No focal consolidation. IMPRESSION: 1. Right IJ catheter tip at the cavoatrial junction. Chest tube tip near the mid mediastinum. 2. Status post resection of dominant right hilar node. Electronically Signed   By: Ulyses Jarred M.D.   On: 02/13/2016 13:32    Assessment/Plan: S/P Procedure(s) (LRB): VIDEO ASSISTED THORACOSCOPY (VATS)/LUNG RESECTION with multiple biopsies (Right)  1 doing well- hemodyn stable 2 check CXR 3 labs ok 4 home later today if CXR ok 5 routine pulm toilet/rehab   LOS: 2 days    GOLD,WAYNE E 02/15/2016 Patient seen and examined, agree with above If CXR ok may be able to dc later today  Remo Lipps C. Roxan Hockey, MD [facility] [phone]

## 2016-02-15 NOTE — Care Management Note (Signed)
Case Management Note  Patient Details  Name: William [name] MRN: [id number] Date of Birth: 12-Jan-1970  Subjective/Objective:    S/p VATS, lung resection, for dc today, No needs.                  Action/Plan:   Expected Discharge Date:  02/15/16               Expected Discharge Plan:  Home/Self Care  In-House Referral:     Discharge planning Services  CM Consult  Post Acute Care Choice:    Choice offered to:     DME Arranged:    DME Agency:     HH Arranged:    HH Agency:     Status of Service:  Completed, signed off  If discussed at [employer] of Stay Meetings, dates discussed:    Additional Comments:  [name], RN 02/15/2016, 12:00 PM

## 2016-02-15 NOTE — Progress Notes (Signed)
Pt d/c home per MD order, Erin/PA stated ok to d/c pt, d/c instructions given to pt and family, pt encouraged and educated to follow d/c instructions, scripts given/instructions given, pt verbalized understanding, pt's wife at [facility], all questions answered

## 2016-02-19 ENCOUNTER — Telehealth: Payer: Self-pay | Admitting: Surgical

## 2016-02-19 NOTE — Telephone Encounter (Signed)
      [address]       [place] [address]             [place] [id number]   S/P  PHYSICIAN:  Revonda Standard. Roxan Hockey, M.D.DATE OF BIRTH:  1970/04/22  DATE OF PROCEDURE:  02/13/2016 DATE OF DISCHARGE:                              OPERATIVE REPORT   PREOPERATIVE DIAGNOSIS:  Right lung mass.  POSTOPERATIVE DIAGNOSIS:  Right hilar adenopathy.  PROCEDURE:  Right VATS with resection of hilar lymph nodes and On-Q local anesthetic catheter placement.  SURGEON:  Revonda Standard. Roxan Hockey, [place]:  John Giovanni, PA-C.  ANESTHESIA:  General.   Discharged home on 02/14/16  Medications: Allergies as of 02/19/2016   No Known Allergies     Medication List       Accurate as of 02/19/16  1:30 PM. Always use your most recent med list.          acetaminophen 500 MG tablet Commonly known as:  TYLENOL Take 2 tablets (1,000 mg total) by mouth every 6 (six) hours as needed for mild pain or fever.   ALPRAZolam 0.5 MG tablet Commonly known as:  XANAX Take 1 tablet (0.5 mg total) by mouth 2 (two) times daily as needed for anxiety.   BC FAST PAIN RELIEF PO Take 1 packet by mouth every 8 (eight) hours as needed (for pain relief.).   fluticasone 50 MCG/ACT nasal spray Commonly known as:  FLONASE Place 1-2 sprays into both nostrils daily as needed (for nasal congestion.).   omeprazole 40 MG capsule Commonly known as:  PRILOSEC Take 40 mg by mouth daily as needed (heartburn/eating spicy foods).   REWETTING DROPS Soln Apply 1-2 drops to eye 2 (two) times daily as needed (for dry eyes).   SINUS RINSE NA Place 1 application into the nose 2 (two) times daily.   traMADol 50 MG tablet Commonly known as:  ULTRAM Take 1-2 tablets (50-100 mg total) by mouth every 6 (six) hours as needed (moderate to severe pain).       Coumadin:  INR check No  Problems/Concerns: some pain but tolerable with current meds He is no having any  other current issues.   Assessment:  Patient is doing very well.   Further instructions provided.  Contact office if concerns or problems develop  Follow up Appointment: scheduled

## 2016-02-25 ENCOUNTER — Other Ambulatory Visit: Payer: Self-pay | Admitting: Thoracic Surgery (Cardiothoracic Vascular Surgery)

## 2016-02-25 DIAGNOSIS — R918 Other nonspecific abnormal finding of lung field: Secondary | ICD-10-CM

## 2016-02-26 ENCOUNTER — Ambulatory Visit
Admission: RE | Admit: 2016-02-26 | Discharge: 2016-02-26 | Disposition: A | Discharge status: Home / Self Care | Payer: No Typology Code available for payment source | Source: Ambulatory Visit | Attending: Thoracic Surgery (Cardiothoracic Vascular Surgery) | Admitting: Thoracic Surgery (Cardiothoracic Vascular Surgery)

## 2016-02-26 ENCOUNTER — Ambulatory Visit (INDEPENDENT_AMBULATORY_CARE_PROVIDER_SITE_OTHER): Payer: Self-pay | Admitting: Thoracic Surgery (Cardiothoracic Vascular Surgery)

## 2016-02-26 ENCOUNTER — Encounter: Payer: Self-pay | Admitting: Thoracic Surgery (Cardiothoracic Vascular Surgery)

## 2016-02-26 VITALS — BP 139/90 | HR 85 | Resp 20 | Ht 70.0 in | Wt 250.0 lb

## 2016-02-26 DIAGNOSIS — Z09 Encounter for follow-up examination after completed treatment for conditions other than malignant neoplasm: Secondary | ICD-10-CM

## 2016-02-26 DIAGNOSIS — R590 Localized enlarged lymph nodes: Secondary | ICD-10-CM

## 2016-02-26 DIAGNOSIS — R918 Other nonspecific abnormal finding of lung field: Secondary | ICD-10-CM

## 2016-02-26 NOTE — Progress Notes (Signed)
[address]       [place] [address]             [phone]    HPI: Mr. Heitzman returns for scheduled postoperative follow-up visit  Mr. Stophel is a 46 year old man who was found to have a right hilar mass last fall after he was hit in the nose and face with a baseball. On PET CT the mass was hypermetabolic. Bronchoscopy and endobronchial ultrasound was nondiagnostic. He was initially reluctant but ultimately agreed to surgery. I did right vats on him on 02/13/2016. The mass was a lymph node. There were multiple other enlarged nodes around it. We removed the lymph nodes. He did well postoperatively and went home on day 2. Pathology showed Castleman's disease.  He still has some soreness in the left chest. He feels some shortness of breath when he exerts himself. Overall he feels well.  Past Medical History:  Diagnosis Date  . Anxiety    situational  . Deviated nasal septum   . GERD (gastroesophageal reflux disease)    take otc   . Hypertension   . Sleep apnea    tested for OSA 2017      Current Outpatient Prescriptions  Medication Sig Dispense Refill  . fluticasone (FLONASE) 50 MCG/ACT nasal spray Place 1-2 sprays into both nostrils daily as needed (for nasal congestion.).   0  . acetaminophen (TYLENOL) 500 MG tablet Take 2 tablets (1,000 mg total) by mouth every 6 (six) hours as needed for mild pain or fever. (Patient not taking: Reported on 02/26/2016) 30 tablet 0  . ALPRAZolam (XANAX) 0.5 MG tablet Take 1 tablet (0.5 mg total) by mouth 2 (two) times daily as needed for anxiety. (Patient not taking: Reported on 02/26/2016) 60 tablet 0  . Aspirin-Caffeine (BC FAST PAIN RELIEF PO) Take 1 packet by mouth every 8 (eight) hours as needed (for pain relief.).    Marland Kitchen Hypertonic Nasal Wash (SINUS RINSE NA) Place 1 application into the nose 2 (two) times daily.    Marland Kitchen omeprazole (PRILOSEC) 40 MG capsule Take 40 mg by mouth daily as needed (heartburn/eating spicy foods).   3   . Soft Lens Products (REWETTING DROPS) SOLN Apply 1-2 drops to eye 2 (two) times daily as needed (for dry eyes).    . traMADol (ULTRAM) 50 MG tablet Take 1-2 tablets (50-100 mg total) by mouth every 6 (six) hours as needed (moderate to severe pain). (Patient not taking: Reported on 02/26/2016) 30 tablet 0   No current facility-administered medications for this visit.     Physical Exam BP 139/90   Pulse 85   Resp 20   Ht 5\' 10"  (1.778 m)   Wt 250 lb (113.4 kg)   SpO2 98% Comment: RA  BMI 35.67 kg/m  46 year old man in no acute distress Alert and oriented 3 no focal deficits Lungs clear with equal breath sounds bilaterally Incisions healing well Cardiac regular rate and rhythm normal S1 and S2  Diagnostic Tests: CHEST  2 VIEW  COMPARISON:  02/15/2016  FINDINGS: Improved aeration in the right lung compared with the prior study. Mild atelectasis around the right hilum with associated postsurgical changes and clips. No pneumothorax or effusion. Left lung clear. Central line removed.  IMPRESSION: Improved aeration in the right lung following resection of right hilar lymph nodes. No acute abnormality.   Electronically Signed   By: Franchot Gallo M.D.   On: 02/26/2016 15:52 I personally reviewed her chest  x-ray and concur with the findings noted above  Impression: Mr. Goetze is a 46 year old man who had right VATS for lymphadenectomy for what turned out to be Castleman's disease. There were multiple nodes in the area that were positive. He is recovering well from the surgery.  He is no longer taking pain medication. He may begin driving. Appropriate precautions were discussed.  He was cautioned to build into new activities gradually over the next several weeks.  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 It will be at least a month before he is ready to do that. We set a tentative date for March 26. We may have to adjust that depending on his level of  discomfort.  I think it would be wise to have him seen by an oncologist even though this is not cancer per se. He will likely need follow-up.  Plan: Referral to oncology at [facility] see him back in 2 months to check on his progress.  Melrose Nakayama, MD [facility] [phone]

## 2016-03-12 DIAGNOSIS — D47Z2 Castleman disease: Secondary | ICD-10-CM | POA: Diagnosis not present

## 2016-04-28 ENCOUNTER — Other Ambulatory Visit: Payer: Self-pay | Admitting: Thoracic Surgery (Cardiothoracic Vascular Surgery)

## 2016-04-28 DIAGNOSIS — R918 Other nonspecific abnormal finding of lung field: Secondary | ICD-10-CM

## 2016-04-29 ENCOUNTER — Encounter: Payer: Self-pay | Admitting: Thoracic Surgery (Cardiothoracic Vascular Surgery)

## 2016-04-29 ENCOUNTER — Ambulatory Visit (INDEPENDENT_AMBULATORY_CARE_PROVIDER_SITE_OTHER): Payer: Self-pay | Admitting: Thoracic Surgery (Cardiothoracic Vascular Surgery)

## 2016-04-29 ENCOUNTER — Ambulatory Visit
Admission: RE | Admit: 2016-04-29 | Discharge: 2016-04-29 | Disposition: A | Discharge status: Home / Self Care | Payer: No Typology Code available for payment source | Source: Ambulatory Visit | Attending: Thoracic Surgery (Cardiothoracic Vascular Surgery) | Admitting: Thoracic Surgery (Cardiothoracic Vascular Surgery)

## 2016-04-29 VITALS — BP 145/88 | HR 86 | Resp 20 | Ht 70.0 in | Wt 265.0 lb

## 2016-04-29 DIAGNOSIS — R918 Other nonspecific abnormal finding of lung field: Secondary | ICD-10-CM

## 2016-04-29 DIAGNOSIS — D47Z2 Castleman disease: Secondary | ICD-10-CM

## 2016-04-29 DIAGNOSIS — Z09 Encounter for follow-up examination after completed treatment for conditions other than malignant neoplasm: Secondary | ICD-10-CM

## 2016-04-29 NOTE — Progress Notes (Signed)
      [address]       [place] [address]             [phone]       HPI: Mr. Gallina for 3 month follow-up visit.  He is a 46 year old man who was found to have a right hilar mass last fall. He had bronchoscopy and endobronchial ultrasound nondiagnostic. After initial reluctance he ultimately agreed to have surgery. We did right VATS on 02/13/2016. The hilar mass turned out to be adenopathy secondary to Castleman's disease.  He did well postoperatively and went home on day 2. I saw him in the office on 02/26/2016. He had some soreness in the left chest and some shortness of breath with exertion but overall was doing well.  He saw Dr. Hinton Rao in [place] and she has done some additional workup.  He feels well. He occasionally has a little soreness in the area of his incision, but he is back to work and back to full activities.  Past Medical History:  Diagnosis Date  . Anxiety    situational  . Deviated nasal septum   . GERD (gastroesophageal reflux disease)    take otc   . Hypertension   . Sleep apnea    tested for OSA 2017      Current Outpatient Prescriptions  Medication Sig Dispense Refill  . cetirizine (ZYRTEC) 10 MG tablet Take 10 mg by mouth daily.    . fluticasone (FLONASE) 50 MCG/ACT nasal spray Place 1-2 sprays into both nostrils daily as needed (for nasal congestion.).   0   No current facility-administered medications for this visit.     Physical Exam BP (!) 145/88   Pulse 86   Resp 20   Ht 5\' 10"  (1.778 m)   Wt 265 lb (120.2 kg)   SpO2 97% Comment: RA  BMI 38.61 kg/m  46 year old man in no acute distress Alert and oriented 3 with no focal deficits Lungs clear with equal breath sounds bilaterally Incisions well healed  Diagnostic Tests: CHEST  2 VIEW  COMPARISON:  02/26/2016  FINDINGS: There are postsurgical changes in the right hilum. There is no focal parenchymal opacity. There is no pleural effusion or  pneumothorax. The heart and mediastinal contours are unremarkable.  The osseous structures are unremarkable.  IMPRESSION: No active cardiopulmonary disease.   Electronically Signed   By: Kathreen Devoid   On: 04/29/2016 09:21 I personally reviewed chest x-ray and concur with the findings noted above  Impression: Mr. Balthazor is a 46 year old man who had a right hilar mass that turned out to be adenopathy secondary to Castleman's disease. He did well postoperatively and continues to do well 3 months out from surgery. He has minimal residual discomfort.  His Castleman's disease is being evaluated by Dr. Hinton Rao in [place].  Plan: Follow-up is scheduled with Dr. Hinton Rao.  I will be happy to see Mr. Desroches back at any time in the future if I can be of any further assistance with his care.   Melrose Nakayama, MD [facility] [phone]

## 2016-05-22 DIAGNOSIS — D47Z2 Castleman disease: Secondary | ICD-10-CM | POA: Diagnosis not present

## 2017-06-15 DIAGNOSIS — D47Z2 Castleman disease: Secondary | ICD-10-CM | POA: Diagnosis not present

## 2017-09-11 IMAGING — CT CT HEART MORP W/ CTA COR W/ SCORE W/ CA W/CM &/OR W/O CM
1 of 10 series · 1 of 20 positions shown, 2 images · IV contrast (Iodine)
Comparison: none

CLINICAL DATA: 44-year-old male with SOB, hypertension and family
h/o premature CAD.

EXAM:
Cardiac/Coronary  CT
TECHNIQUE: The patient was scanned on a Philips 256 scanner.

[Series 300: locator · axial · 0.35mm/px · z∈[-141,-141]mm · 1 of 1 slices shown, 2 images]
[im 1/1  vessel]
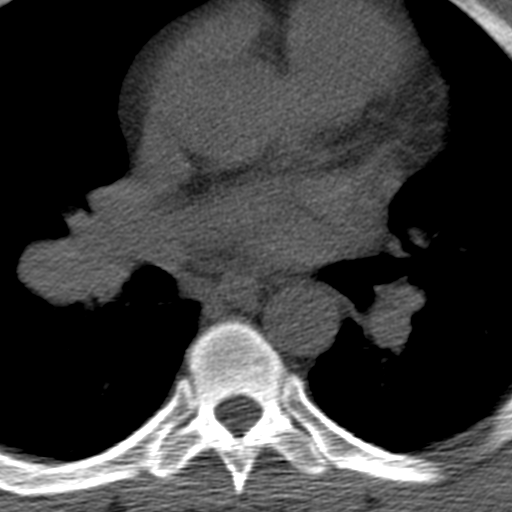
[im 1/1  lung]
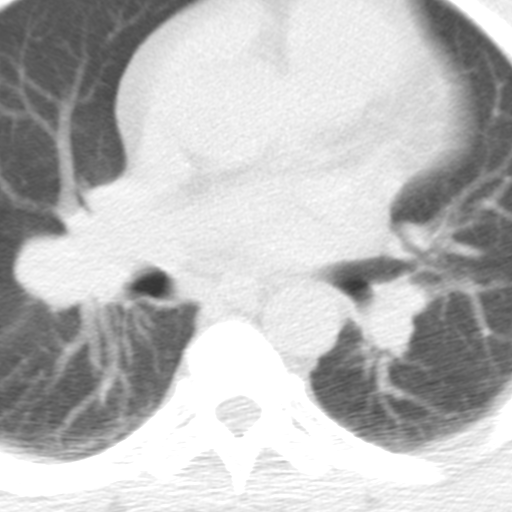

[1 of 20 positions shown; findings below may reference images not displayed]

FINDINGS: A 120 kV prospective scan was triggered in the descending thoracic
aorta at 111 HU's. Axial non-contrast 3 mm slices were carried out
through the heart. The data set was analyzed on a dedicated work
station and scored using the Agatson method. Gantry rotation speed
was 270 msecs and collimation was .9 mm. 5 mg of iv metoprolol and
0.8 mg of sl NTG was given. The 3D data set was reconstructed in 5%
intervals of the 67-82 % of the R-R cycle. Diastolic phases were
analyzed on a dedicated work station using MPR, MIP and VRT modes.
The patient received 80 cc of contrast.

Aorta:  Normal size.  No calcifications.  No dissection.

Aortic Valve:  Trileaflet.  No calcifications.

Coronary Arteries:  Normal coronary origin.  Right dominance.

RCA is a large dominant artery that gives rise to PDA and PLVB.
There is a motion that affects the read but there is no plaque.

Left main is a large artery that gives rise to LAD and LCX arteries.

LAD is a medium caliber vessel that gives rise to two small diagonal
vessels and has no plaque.

LCX is a small non-dominant artery that gives rise to one OM1
branch. There is no plaque.

Other findings:

Normal pulmonary vein drainage into the left atrium.

Normal let atrial appendage without a thrombus.

Mild to moderate concentric left ventricular hypertrophy.

Upper normal size of the pulmonary artery measuring 30 mm.

No ASD or VSD seen.
IMPRESSION: 1. Coronary calcium score of 0. This was 0 percentile for age and
sex matched control.

2. Normal coronary origin with right dominance.

3. No evidence of CAD.

4. Mild to moderate concentric left ventricular hypertrophy.

5. Upper normal size of the pulmonary artery measuring 30 mm.

Dominik Brinkman

## 2017-09-11 IMAGING — CT CT MAXILLOFACIAL W/O CM
3 series · 16 of 47 positions shown, 19 images · non-contrast
Comparison: None.

CLINICAL DATA: Pt got hit in nose with baseball 3 months ago and
since then pt has had some episodes of SOB and pt states his wife is
telling him he is snoring louder. Pt states he feels congested
around his nose and pressure around nose and under his eyes.

EXAM:
CT MAXILLOFACIAL WITHOUT CONTRAST
TECHNIQUE: Multidetector CT imaging of the maxillofacial structures was
performed. Multiplanar CT image reconstructions were also generated.
A small metallic BB was placed on the right temple in order to
reliably differentiate right from left.

[Series 201: facial bones, idose (1) · axial · 0.36mm/px · z∈[+38,+182]mm · 10 of 84 slices shown, 13 images]
[im 6/84  brain]
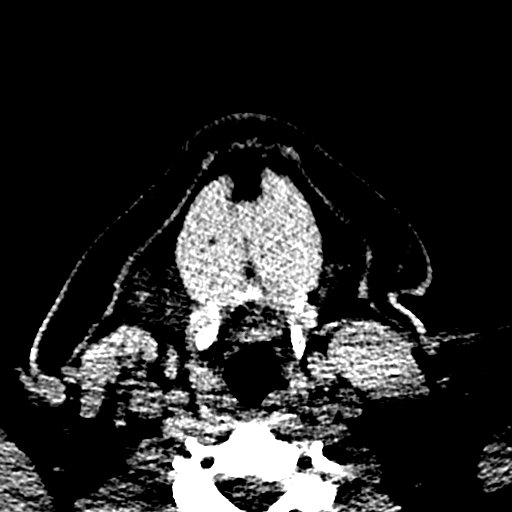
[im 6/84  bone]
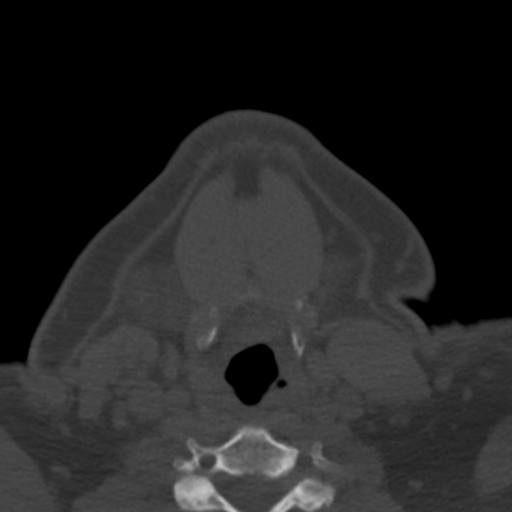
[im 15/84  bone]
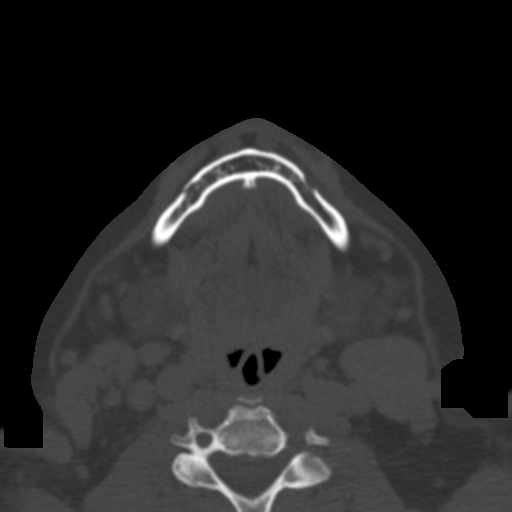
[im 23/84  bone]
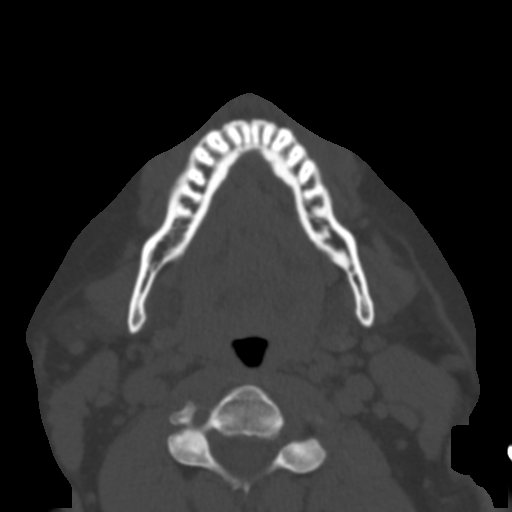
[im 29/84  bone]
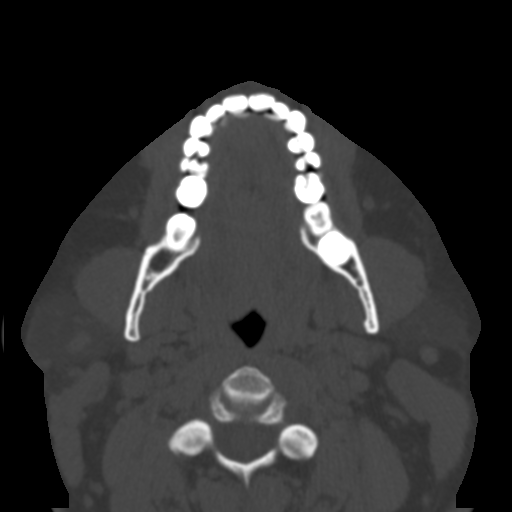
[im 38/84  brain]
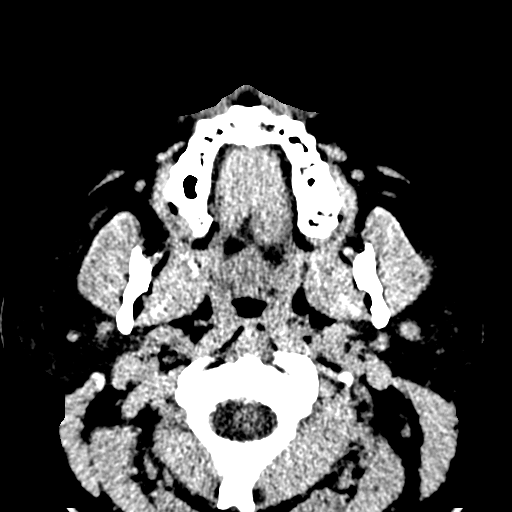
[im 38/84  bone]
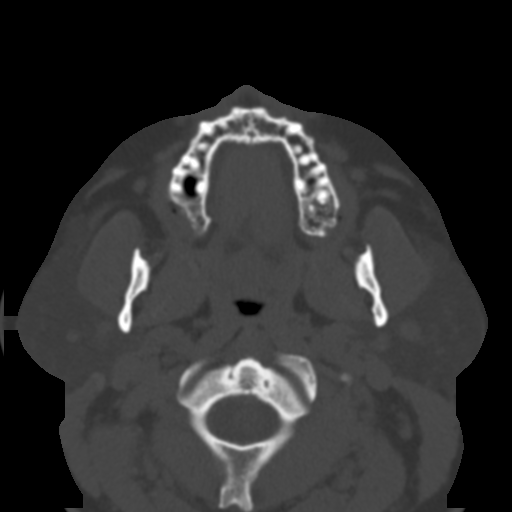
[im 46/84  bone]
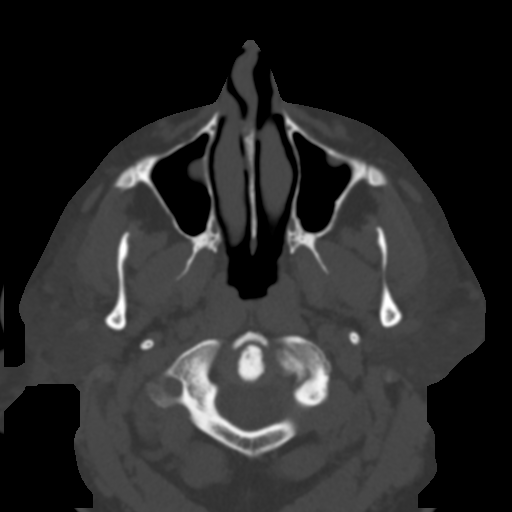
[im 55/84  bone]
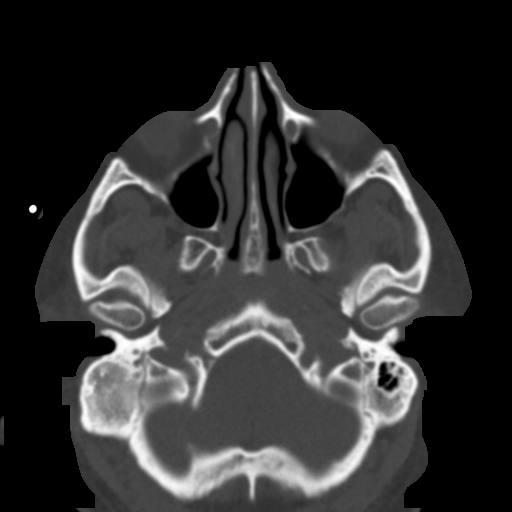
[im 63/84  bone]
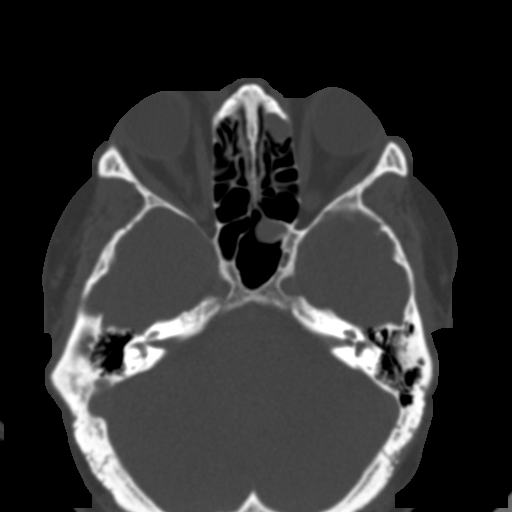
[im 69/84  brain]
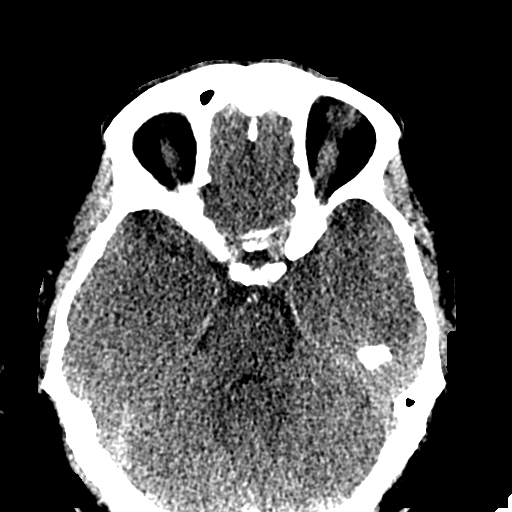
[im 69/84  bone]
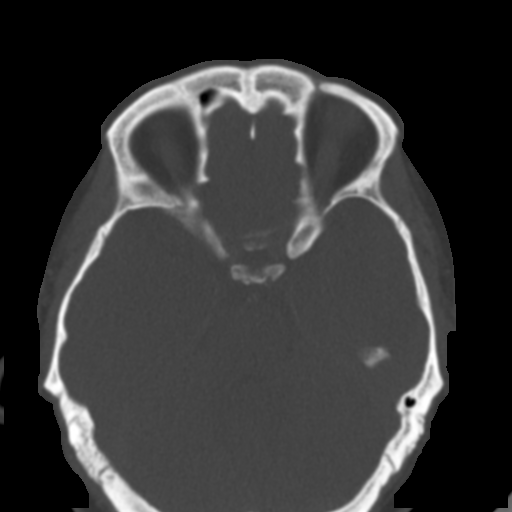
[im 78/84  bone]
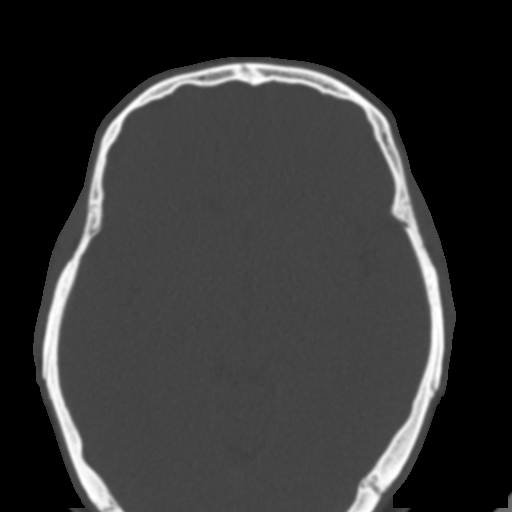

[Series 203: coronal std, idose (1) · coronal · 0.34mm/px · 3 of 88 slices shown]
[im 30/88  bone]
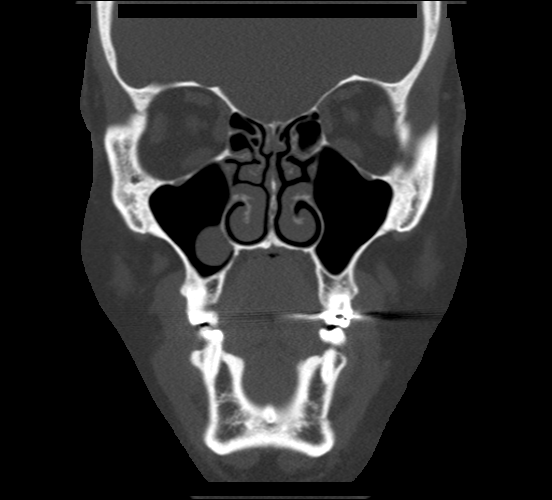
[im 39/88  bone]
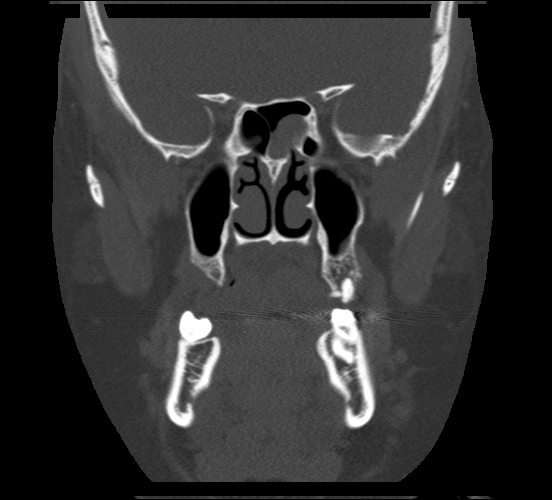
[im 49/88  bone]
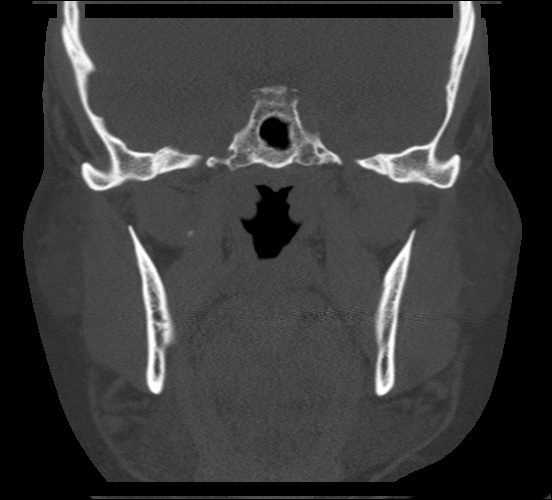

[Series 204: sagittal std, idose (1) · sagittal · 0.34mm/px · 3 of 89 slices shown]
[im 30/89  bone]
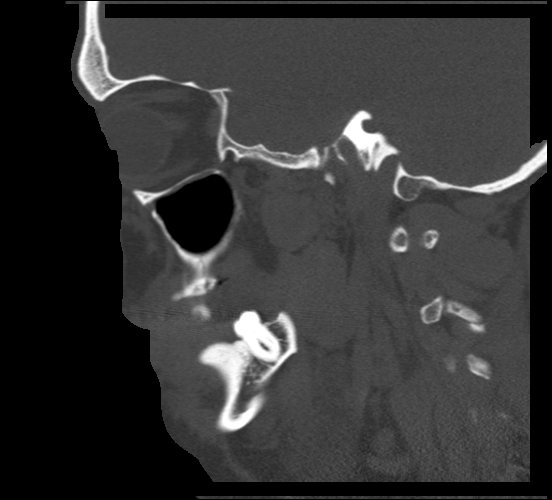
[im 45/89  bone]
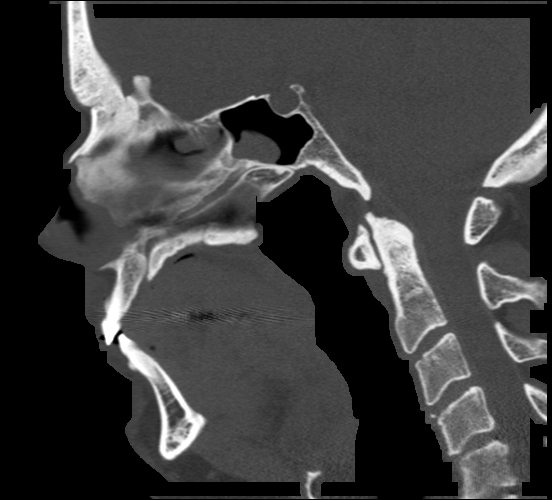
[im 59/89  bone]
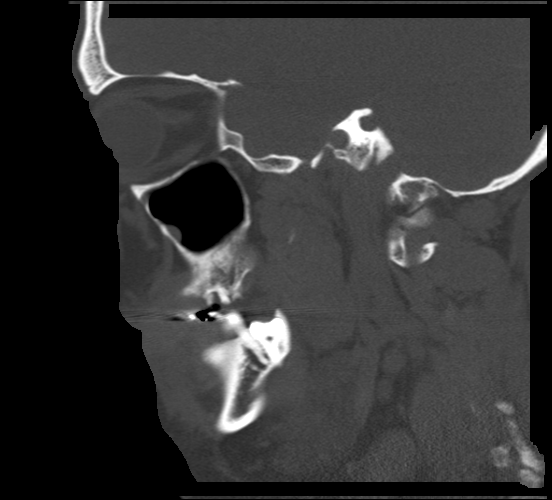

[16 of 47 positions shown; findings below may reference images not displayed]

FINDINGS: Osseous: No fracture or mandibular dislocation. No destructive
process.

Orbits: Negative. No traumatic or inflammatory finding.

Sinuses: Single anterior left ethmoid air cell is opacified with
mucosal thickening. Small mucous retention cyst in the anterior left
sphenoid sinus. Left frontal sinus is aplastic and right is
hypoplastic. Small mucous retention cysts in both maxillary sinuses.
Remaining sinuses are clear. Right ostiomeatal complex is narrowed
by mucosal thickening aggravated by a right concha bullosa. There is
a left concha bullosa without significant left ostiomeatal complex
narrowing. Septum is near midline. Clear mastoid air cells and
middle ear cavities.

Soft tissues: No soft tissue masses. No inflammatory change. No
adenopathy.

Limited intracranial: Unremarkable.
IMPRESSION: 1. No evidence of a recent or older fracture.
2. Mucosal thickening and a right concha bullosa narrows the right
ostiomeatal complex.
3. Mild sinus disease as detailed above. No evidence of acute
sinusitis.
4. No other abnormalities.

## 2023-03-31 ENCOUNTER — Ambulatory Visit (HOSPITAL_BASED_OUTPATIENT_CLINIC_OR_DEPARTMENT_OTHER)
Admission: EM | Admit: 2023-03-31 | Discharge: 2023-03-31 | Disposition: A | Discharge status: Home / Self Care | Attending: Family Medicine | Admitting: Family Medicine

## 2023-03-31 ENCOUNTER — Other Ambulatory Visit: Payer: Self-pay

## 2023-03-31 ENCOUNTER — Encounter (HOSPITAL_BASED_OUTPATIENT_CLINIC_OR_DEPARTMENT_OTHER): Payer: Self-pay | Admitting: Emergency Medicine

## 2023-03-31 ENCOUNTER — Ambulatory Visit (HOSPITAL_BASED_OUTPATIENT_CLINIC_OR_DEPARTMENT_OTHER)
Admit: 2023-03-31 | Discharge: 2023-03-31 | Disposition: A | Discharge status: Home / Self Care | Attending: Family Medicine | Admitting: Radiology

## 2023-03-31 DIAGNOSIS — M25571 Pain in right ankle and joints of right foot: Secondary | ICD-10-CM

## 2023-03-31 MED ORDER — PREDNISONE 20 MG PO TABS: 40.0000 mg | ORAL_TABLET | Freq: Every day | ORAL | 0 refills | Status: AC

## 2023-03-31 MED ORDER — ONDANSETRON 4 MG PO TBDP: 4.0000 mg | ORAL_TABLET | Freq: Once | ORAL | Status: DC

## 2023-03-31 MED ORDER — CEFTRIAXONE SODIUM 1 G IJ SOLR: 500.0000 mg | INTRAMUSCULAR | Status: DC

## 2023-03-31 MED ORDER — COLCHICINE 0.6 MG PO TABS: 0.6000 mg | ORAL_TABLET | Freq: Every day | ORAL | 0 refills | Status: AC

## 2023-03-31 NOTE — ED Triage Notes (Signed)
 Patient c/o right foot pain. States he usually has gout in his left foot but feels like it is in the right now.

## 2023-03-31 NOTE — ED Provider Notes (Signed)
 [name]    CSN: [id number] Arrival date & time: 03/31/23  1008      History   Chief Complaint No chief complaint on file.   HPI William [name] is a 53 y.o. male.   53 year old male presents today with right ankle pain, swelling.  Follow-up may be gout flareup so this past Friday took prednisone and colchicine he had leftover.  Reports not much improvement from this.  He also stepped in a hole on Saturday and potentially twisted ankle.  Pain mostly with walking.  There is no increased redness or warmth to the area.  There is no fever.  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   Chest pain 10/14/2015   Lung mass 10/14/2015   History of snoring 10/14/2015   Hypertension    Dyspnea     Past Surgical History:  Procedure Laterality Date   BACK SURGERY  2002ish   moicrodisectomy   ENDOBRONCHIAL ULTRASOUND N/A 11/21/2015   Procedure: ENDOBRONCHIAL ULTRASOUND;  Surgeon: Leslye Peer, MD;  Location: [facility] OR;  Service: Thoracic;  Laterality: N/A;   VIDEO ASSISTED THORACOSCOPY (VATS)/WEDGE RESECTION Right 02/13/2016   Procedure: VIDEO ASSISTED THORACOSCOPY (VATS)/LUNG RESECTION with multiple biopsies;  Surgeon: Loreli Slot, MD;  Location: [facility] OR;  Service: Thoracic;  Laterality: Right;   VIDEO BRONCHOSCOPY WITH ENDOBRONCHIAL NAVIGATION N/A 11/21/2015   Procedure: VIDEO BRONCHOSCOPY WITH ENDOBRONCHIAL NAVIGATION;  Surgeon: Leslye Peer, MD;  Location: [facility] OR;  Service: Thoracic;  Laterality: N/A;       Home Medications    Prior to Admission medications   Medication Sig Start Date End Date Taking? Authorizing Provider  colchicine 0.6 MG tablet Take 1 tablet (0.6 mg  total) by mouth daily. 03/31/23  Yes Vivi Piccirilli A, FNP  predniSONE (DELTASONE) 20 MG tablet Take 2 tablets (40 mg total) by mouth daily with breakfast for 5 days. 03/31/23 04/05/23 Yes Nariyah Osias A, FNP  cetirizine (ZYRTEC) 10 MG tablet Take 10 mg by mouth daily.    [provider]  fluticasone (FLONASE) 50 MCG/ACT nasal spray Place 1-2 sprays into both nostrils daily as needed (for nasal congestion.).  11/05/15   [provider]    Family History Family History  Problem Relation Age of Onset   Heart attack Father    Heart attack Paternal Grandfather     Social History Social History   Tobacco Use   Smoking status: Never   Smokeless tobacco: Never  Substance Use Topics   Alcohol use: No   Drug use: No     Allergies   Patient has no known allergies.   Review of Systems Review of Systems  See HPI Physical Exam Triage Vital Signs ED Triage Vitals [03/31/23 1024]  Encounter Vitals Group     BP (!) 147/102     Systolic BP Percentile      Diastolic BP Percentile      Pulse Rate 84     Resp 15     Temp 98.2 F (36.8 C)     Temp Source Oral     SpO2  95 %     Weight      Height      Head Circumference      Peak Flow      Pain Score 7     Pain Loc      Pain Education      Exclude from Growth Chart    No data found.  Updated Vital Signs BP (!) 147/102 (BP Location: Right Arm)   Pulse 84   Temp 98.2 F (36.8 C) (Oral)   Resp 15   SpO2 95%   Visual Acuity Right Eye Distance:   Left Eye Distance:   Bilateral Distance:    Right Eye Near:   Left Eye Near:    Bilateral Near:     Physical Exam Musculoskeletal:        General: Swelling and tenderness present. No deformity or signs of injury.     Comments: Generalized swelling to right ankle without any increased warmth or redness Decreased range of motion 2+ pedal pulse    Skin:    Findings: No erythema.      UC Treatments / Results  Labs (all labs ordered are listed, but only  abnormal results are displayed) Labs Reviewed - No data to display  EKG   Radiology No results found.  Procedures Procedures (including critical care time)  Medications Ordered in UC Medications - No data to display  Initial Impression / Assessment and Plan / UC Course  I have reviewed the triage vital signs and the nursing notes.  Pertinent labs & imaging results that were available during my care of the patient were reviewed by me and considered in my medical decision making (see chart for details).     Acute gout versus ankle sprain-there is no specific redness or increased warmth on exam.  Could be sprained ankle.  Recommend rest, ice, elevate and compression.  Will go ahead and prescribe prednisone and colchicine to use to see if this helps Otherwise if not improved or worsens need to follow-up with his doctor. Final Clinical Impressions(s) / UC Diagnoses   Final diagnoses:  Pain in joint involving right ankle and foot     Discharge Instructions      I will call you with x ray results when I get them. Not seeing anything obvious here.  Giving you some medication in case this is gout.  If sprained ankle you should rest, ice elevated and compression on the ankle.  See your PCP for any continued issues.     ED Prescriptions     Medication Sig Dispense Auth. Provider   predniSONE (DELTASONE) 20 MG tablet Take 2 tablets (40 mg total) by mouth daily with breakfast for 5 days. 10 tablet Nanna Ertle A, FNP   colchicine 0.6 MG tablet Take 1 tablet (0.6 mg total) by mouth daily. 10 tablet Janace Aris, FNP      PDMP not reviewed this encounter.   Janace Aris, FNP 03/31/23 1259

## 2023-03-31 NOTE — Discharge Instructions (Signed)
 I will call you with x ray results when I get them. Not seeing anything obvious here.  Giving you some medication in case this is gout.  If sprained ankle you should rest, ice elevated and compression on the ankle.  See your PCP for any continued issues.
